# Patient Record
Sex: Female | Born: 1990 | Race: Black or African American | Hispanic: No | Marital: Single | Smoking: Current some day smoker
Health system: Southern US, Community
[De-identification: ages and names within clinical notes are randomized; demographics above are authoritative.]

## PROBLEM LIST (undated history)

## (undated) ENCOUNTER — Inpatient Hospital Stay (HOSPITAL_COMMUNITY): Payer: Self-pay

## (undated) DIAGNOSIS — Z973 Presence of spectacles and contact lenses: Secondary | ICD-10-CM

## (undated) DIAGNOSIS — J302 Other seasonal allergic rhinitis: Secondary | ICD-10-CM

## (undated) DIAGNOSIS — J45909 Unspecified asthma, uncomplicated: Secondary | ICD-10-CM

## (undated) HISTORY — DX: Other seasonal allergic rhinitis: J30.2

## (undated) HISTORY — DX: Unspecified asthma, uncomplicated: J45.909

## (undated) HISTORY — DX: Presence of spectacles and contact lenses: Z97.3

## (undated) HISTORY — PX: NO PAST SURGERIES: SHX2092

---
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
## 2012-05-12 ENCOUNTER — Ambulatory Visit: Payer: Self-pay | Admitting: Medical

## 2012-05-23 ENCOUNTER — Ambulatory Visit (INDEPENDENT_AMBULATORY_CARE_PROVIDER_SITE_OTHER): Payer: BC Managed Care – PPO | Admitting: Medical

## 2012-05-23 ENCOUNTER — Encounter: Payer: Self-pay | Admitting: Medical

## 2012-05-23 VITALS — BP 108/70 | HR 68 | Temp 98.0°F | Resp 16 | Ht 62.5 in | Wt 146.0 lb

## 2012-05-23 DIAGNOSIS — Z2089 Contact with and (suspected) exposure to other communicable diseases: Secondary | ICD-10-CM

## 2012-05-23 DIAGNOSIS — A599 Trichomoniasis, unspecified: Secondary | ICD-10-CM

## 2012-05-23 DIAGNOSIS — J45909 Unspecified asthma, uncomplicated: Secondary | ICD-10-CM

## 2012-05-23 DIAGNOSIS — Z202 Contact with and (suspected) exposure to infections with a predominantly sexual mode of transmission: Secondary | ICD-10-CM

## 2012-05-23 DIAGNOSIS — J309 Allergic rhinitis, unspecified: Secondary | ICD-10-CM

## 2012-05-23 DIAGNOSIS — Z0000 Encounter for general adult medical examination without abnormal findings: Secondary | ICD-10-CM

## 2012-05-23 DIAGNOSIS — J302 Other seasonal allergic rhinitis: Secondary | ICD-10-CM

## 2012-05-23 LAB — POCT WET PREP (WET MOUNT)
Bacteria Wet Prep HPF POC: NEGATIVE
Clue Cells Wet Prep Whiff POC: NEGATIVE
WBC, Wet Prep HPF POC: NEGATIVE

## 2012-05-23 LAB — LIPID PANEL
Cholesterol: 139 mg/dL (ref 0–200)
LDL Cholesterol: 81 mg/dL (ref 0–99)
Total CHOL/HDL Ratio: 3.2 Ratio
VLDL: 15 mg/dL (ref 0–40)

## 2012-05-23 LAB — CBC
HCT: 38.9 % (ref 36.0–46.0)
Hemoglobin: 12.7 g/dL (ref 12.0–15.0)
MCV: 81.6 fL (ref 78.0–100.0)
RBC: 4.77 MIL/uL (ref 3.87–5.11)
WBC: 6.5 10*3/uL (ref 4.0–10.5)

## 2012-05-23 LAB — BASIC METABOLIC PANEL
BUN: 17 mg/dL (ref 6–23)
Chloride: 104 mEq/L (ref 96–112)
Creat: 0.84 mg/dL (ref 0.50–1.10)
Potassium: 4.4 mEq/L (ref 3.5–5.3)

## 2012-05-23 MED ORDER — ALBUTEROL SULFATE HFA 108 (90 BASE) MCG/ACT IN AERS: 2.0000 | INHALATION_SPRAY | Freq: Four times a day (QID) | RESPIRATORY_TRACT | Status: DC | PRN

## 2012-05-23 NOTE — Patient Instructions (Signed)
I prescribed Albuterol inhaler today.   You can use 2 puffs before exercise.    If this is not helping, then return to discuss.  I do want you exercising regularly.  Use condoms! Always.    Consider the birth control options we discussed today.  We will call you with lab results.   I would like to see you back in a month or 2 to see if the albuterol is helping with exercise.

## 2012-05-23 NOTE — Progress Notes (Signed)
Subjective:   HPI  Kim Kirby is a 22 y.o. female who presents for a complete physical.  New patient today.   Her mother is a patient of mine.   She is mainly concerned about getting re screened for STD.  She was seen by provider in [place] recently and was +trichomonas asymptomatic.   She was negative for HIV, syphilis, gonorrhea, chlamydia, but ended up having treatment for chlamydia and trich.   She was on OCPs few years ago, but stopped due to fears of side effects and issues with not remembering to take the medication.  She has 1 sexual partner, using condoms now.  No prior pregnancy.  Periods are regular.  Hx/o exercise induced asthma.   No recent flare up, but not exercising either .  Currently dose not have an inhaler.   Past Medical History  Diagnosis Date  . Asthma   . Seasonal allergic rhinitis     spring  . Wears glasses     History reviewed. No pertinent past surgical history.  Family History  Problem Relation Age of Onset  . Hypertension Mother   . Other Father     unknown  . Cancer Maternal Aunt     lung  . Heart disease Neg Hx   . Diabetes Neg Hx   . Stroke Neg Hx     History   Social History  . Marital Status: Single    Spouse Name: N/A    Number of Children: N/A  . Years of Education: N/A   Occupational History  . Not on file.   Social History Main Topics  . Smoking status: Current Some Day Smoker  . Smokeless tobacco: Not on file  . Alcohol Use: No     Comment: sometimes  . Drug Use: Yes    Special: Marijuana  . Sexually Active: Not on file   Other Topics Concern  . 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 albuterol (PROVENTIL HFA;VENTOLIN HFA) 108 (90 BASE) MCG/ACT inhaler Inhale 2 puffs into the lungs every 6 (six) hours as needed for wheezing.  1 Inhaler  1     No Known Allergies  Reviewed their medical, surgical, family, social, medication, and allergy history and updated chart as appropriate.   Review of Systems Constitutional: -fever, -chills, -sweats, -unexpected weight change, -decreased appetite, -fatigue Allergy: -sneezing, -itching, -congestion Dermatology: -changing moles, --rash, -lumps ENT: -runny nose, -ear pain, -sore throat, -hoarseness, -sinus pain, -teeth pain, - ringing in ears, -hearing loss, -nosebleeds Cardiology: -chest pain, -palpitations, -swelling, -difficulty breathing when lying flat, -waking up short of breath Respiratory: -cough, -shortness of breath, -difficulty breathing with exercise or exertion, -wheezing, -coughing up blood Gastroenterology: -abdominal pain, -nausea, -vomiting, -diarrhea, -constipation, -blood in stool, -changes in bowel movement, -difficulty swallowing or eating Hematology: -bleeding, -bruising  Musculoskeletal: -joint aches, -muscle aches, -joint swelling, -back pain, -neck pain, -cramping, -changes in gait Ophthalmology: denies vision changes, eye redness, itching, discharge Urology: -burning with urination, -difficulty urinating, -blood in urine, -urinary frequency, -urgency, -incontinence Neurology: -headache, -weakness, -tingling, -numbness, -memory loss, -falls, -dizziness Psychology: -depressed mood, -agitation, -sleep problems     Objective:   Physical Exam  Filed Vitals:   05/23/12 0858  BP: 108/70  Pulse: 68  Temp: 98 F (36.7 C)  Resp: 16  General appearance: alert, no distress, WD/WN, AA female Skin: multiple tattoos - left chest, right wrist, other tattoos.  Few scattered macules.   HEENT: normocephalic, conjunctiva/corneas normal, sclerae anicteric, PERRLA, EOMi, nares patent, no discharge or erythema, pharynx normal Oral cavity: MMM, tongue normal, teeth normal Neck: supple, no lymphadenopathy, no thyromegaly, no masses, normal ROM Chest: non tender, normal shape  and expansion Heart: RRR, normal S1, S2, no murmurs Lungs: CTA bilaterally, no wheezes, rhonchi, or rales Abdomen: +bs, soft, non tender, non distended, no masses, no hepatomegaly, no splenomegaly, no bruits Back: non tender, normal ROM, no scoliosis Musculoskeletal: upper extremities non tender, no obvious deformity, normal ROM throughout, lower extremities non tender, no obvious deformity, normal ROM throughout Extremities: no edema, no cyanosis, no clubbing Pulses: 2+ symmetric, upper and lower extremities, normal cap refill Neurological: alert, oriented x 3, CN2-12 intact, strength normal upper extremities and lower extremities, sensation normal throughout, DTRs 2+ throughout, no cerebellar signs, gait normal Psychiatric: normal affect, behavior normal, pleasant  Breast: deferred Gyn: Normal external genitalia without lesions, vagina with normal mucosa, cervix without lesions, no cervical motion tenderness, no abnormal vaginal discharge.  Uterus and adnexa not enlarged, nontender, no masses. Swabs taken.  Exam chaperoned by nurse. Rectal: deferred    Assessment and Plan :    Encounter Diagnoses  Name Primary?  . Routine general medical examination at a health care facility Yes  . Trichomonas   . Venereal disease contact   . Asthma   . Seasonal allergic rhinitis     Physical exam - discussed healthy lifestyle, diet, exercise, preventative care, vaccinations, and addressed their concerns.  Handout given.  Discussed risks/benefits of OCPs.   She will consider and let me know.  Trichomonas - resolved.   Wet prep without obvious trichomonads, yeasts, or BV today  Venereal disease contact - advised condom use, safe sex, prevention.   Advised STD screening q27mo.    Asthma - discussed her prior symptoms.  New script for albuterol.  discussed use prior to exercise.  Recheck 1-2 mo  Seasonal allergies - advised Zyrtec OTC in early spring  Follow-up pending labs

## 2012-05-24 LAB — GC/CHLAMYDIA PROBE AMP
CT Probe RNA: NEGATIVE
GC Probe RNA: NEGATIVE

## 2012-10-13 LAB — OB RESULTS CONSOLE HGB/HCT, BLOOD: HCT: 38 %

## 2012-10-13 LAB — OB RESULTS CONSOLE ABO/RH: RH Type: POSITIVE

## 2012-10-13 LAB — CULTURE, OB URINE: Urine Culture, OB: NO GROWTH

## 2012-10-13 LAB — OB RESULTS CONSOLE RPR: RPR: NONREACTIVE

## 2012-10-13 LAB — OB RESULTS CONSOLE RUBELLA ANTIBODY, IGM: Rubella: IMMUNE

## 2012-10-13 LAB — OB RESULTS CONSOLE GC/CHLAMYDIA: Chlamydia: NEGATIVE

## 2012-10-13 LAB — OB RESULTS CONSOLE VARICELLA ZOSTER ANTIBODY, IGG: Varicella: IMMUNE

## 2012-10-13 LAB — OB RESULTS CONSOLE HIV ANTIBODY (ROUTINE TESTING): HIV: NONREACTIVE

## 2012-12-02 ENCOUNTER — Encounter: Payer: Self-pay | Admitting: *Deleted

## 2012-12-12 ENCOUNTER — Encounter: Payer: Self-pay | Admitting: Advanced Practice Midwife

## 2012-12-12 ENCOUNTER — Ambulatory Visit (INDEPENDENT_AMBULATORY_CARE_PROVIDER_SITE_OTHER): Payer: BC Managed Care – PPO | Admitting: Advanced Practice Midwife

## 2012-12-12 ENCOUNTER — Encounter: Payer: Self-pay | Admitting: *Deleted

## 2012-12-12 VITALS — BP 114/68 | Temp 97.6°F | Wt 145.0 lb

## 2012-12-12 DIAGNOSIS — O0930 Supervision of pregnancy with insufficient antenatal care, unspecified trimester: Secondary | ICD-10-CM | POA: Insufficient documentation

## 2012-12-12 DIAGNOSIS — Z3400 Encounter for supervision of normal first pregnancy, unspecified trimester: Secondary | ICD-10-CM

## 2012-12-12 LAB — POCT URINALYSIS DIP (DEVICE)
Hgb urine dipstick: NEGATIVE
Nitrite: NEGATIVE
Protein, ur: NEGATIVE mg/dL
Specific Gravity, Urine: 1.025 (ref 1.005–1.030)
Urobilinogen, UA: 0.2 mg/dL (ref 0.0–1.0)

## 2012-12-12 NOTE — Progress Notes (Signed)
   Subjective:    Kim Kirby is a G1P0 [redacted]w[redacted]d being seen today for her first obstetrical visit.  Her obstetrical history is significant for None. Patient does intend to breast feed. Pregnancy history fully reviewed.  Patient reports fatigue.  Filed Vitals:   12/12/12 1007  BP: 114/68  Temp: 97.6 F (36.4 C)  Weight: 145 lb (65.772 kg)    HISTORY: OB History  Gravida Para Term Preterm AB SAB TAB Ectopic Multiple Living  1             # Outcome Date GA Lbr Len/2nd Weight Sex Delivery Anes PTL Lv  1 CUR              Past Medical History  Diagnosis Date  . Asthma   . Seasonal allergic rhinitis     spring  . Wears glasses    History reviewed. No pertinent past surgical history. Family History  Problem Relation Age of Onset  . Hypertension Mother   . Other Father     unknown  . Cancer Maternal Aunt     lung  . Heart disease Neg Hx   . Diabetes Neg Hx   . Stroke Neg Hx      Exam    Uterus:   Fundus @umbilicus   Pelvic Exam: Pelvic exam deferred--done at [facility]   Neurologic: oriented, normal, normal mood, gait normal; reflexes normal and symmetric   Extremities: normal strength, tone, and muscle mass   HEENT neck supple with midline trachea and thyroid without masses   Mouth/Teeth mucous membranes moist, pharynx normal without lesions   Neck supple and no masses   Cardiovascular: regular rate and rhythm   Respiratory:  appears well, vitals normal, no respiratory distress, acyanotic, normal RR, ear and throat exam is normal, neck free of mass or lymphadenopathy, chest clear, no wheezing, crepitations, rhonchi, normal symmetric air entry   Abdomen:    Urinary:       Assessment:    Pregnancy: G1P0 Patient Active Problem List   Diagnosis Date Noted  . Supervision of normal first pregnancy 12/12/2012        Plan:     Initial labs drawn. Prenatal vitamins. Problem list reviewed and updated. Genetic Screening discussed : declined.  Ultrasound discussed; fetal survey: ordered.  Follow up in 3 weeks. 50% of 30 min visit spent on counseling and coordination of care.    [name], Emeterio Balke 12/12/2012

## 2012-12-12 NOTE — Progress Notes (Signed)
Pulse 119 Edema trace in feet.

## 2012-12-16 ENCOUNTER — Ambulatory Visit (HOSPITAL_COMMUNITY)
Admission: RE | Admit: 2012-12-16 | Discharge: 2012-12-16 | Disposition: A | Discharge status: Home / Self Care | Payer: BC Managed Care – PPO | Source: Ambulatory Visit | Attending: Advanced Practice Midwife | Admitting: Advanced Practice Midwife

## 2012-12-16 DIAGNOSIS — Z3400 Encounter for supervision of normal first pregnancy, unspecified trimester: Secondary | ICD-10-CM

## 2012-12-16 DIAGNOSIS — Z363 Encounter for antenatal screening for malformations: Secondary | ICD-10-CM | POA: Insufficient documentation

## 2012-12-16 DIAGNOSIS — Z1389 Encounter for screening for other disorder: Secondary | ICD-10-CM | POA: Insufficient documentation

## 2012-12-16 DIAGNOSIS — O358XX0 Maternal care for other (suspected) fetal abnormality and damage, not applicable or unspecified: Secondary | ICD-10-CM | POA: Insufficient documentation

## 2012-12-18 ENCOUNTER — Emergency Department (HOSPITAL_COMMUNITY)
Admission: EM | Admit: 2012-12-18 | Discharge: 2012-12-19 | Disposition: A | Discharge status: Home / Self Care | Payer: BC Managed Care – PPO | Attending: Emergency Medicine | Admitting: Emergency Medicine

## 2012-12-18 DIAGNOSIS — K011 Impacted teeth: Secondary | ICD-10-CM

## 2012-12-18 DIAGNOSIS — K006 Disturbances in tooth eruption: Secondary | ICD-10-CM | POA: Insufficient documentation

## 2012-12-18 DIAGNOSIS — O9989 Other specified diseases and conditions complicating pregnancy, childbirth and the puerperium: Secondary | ICD-10-CM | POA: Insufficient documentation

## 2012-12-18 DIAGNOSIS — Z792 Long term (current) use of antibiotics: Secondary | ICD-10-CM | POA: Insufficient documentation

## 2012-12-18 DIAGNOSIS — J45909 Unspecified asthma, uncomplicated: Secondary | ICD-10-CM | POA: Insufficient documentation

## 2012-12-18 DIAGNOSIS — Z79899 Other long term (current) drug therapy: Secondary | ICD-10-CM | POA: Insufficient documentation

## 2012-12-18 DIAGNOSIS — Z87891 Personal history of nicotine dependence: Secondary | ICD-10-CM | POA: Insufficient documentation

## 2012-12-18 DIAGNOSIS — Z8709 Personal history of other diseases of the respiratory system: Secondary | ICD-10-CM | POA: Insufficient documentation

## 2012-12-18 DIAGNOSIS — R6884 Jaw pain: Secondary | ICD-10-CM | POA: Insufficient documentation

## 2012-12-19 ENCOUNTER — Encounter (HOSPITAL_COMMUNITY): Payer: Self-pay

## 2012-12-19 MED ORDER — PENICILLIN V POTASSIUM 500 MG PO TABS: 500.0000 mg | ORAL_TABLET | Freq: Four times a day (QID) | ORAL | Status: AC

## 2012-12-19 MED ORDER — OXYCODONE-ACETAMINOPHEN 5-325 MG PO TABS
1.0000 | ORAL_TABLET | Freq: Once | ORAL | Status: AC
  Administered 2012-12-19: 1 via ORAL
  Filled 2012-12-19: qty 1

## 2012-12-19 NOTE — ED Provider Notes (Signed)
CSN: [id number]     Arrival date & time 12/18/12  2355 History   First MD Initiated Contact with Patient 12/19/12 0018     Chief Complaint  Patient presents with  . Jaw Pain   (Consider location/radiation/quality/duration/timing/severity/associated sxs/prior Treatment) HPI Patient reports 2 days of left-sided jaw pain.  Her pain is been constant.  She denies ear pain.  No fevers or chills.  No difficulty breathing or swallowing.  She reports the tenderness is located in her left posterior jaw.  No facial swelling.  No lymphadenopathy noted by the patient.  Her pain is mild to moderate in severity at this time.  Currently she is 5 months pregnant. Past Medical History  Diagnosis Date  . Asthma   . Seasonal allergic rhinitis     spring  . Wears glasses    History reviewed. No pertinent past surgical history. Family History  Problem Relation Age of Onset  . Hypertension Mother   . Other Father     unknown  . Cancer Maternal Aunt     lung  . Heart disease Neg Hx   . Diabetes Neg Hx   . Stroke Neg Hx    History  Substance Use Topics  . Smoking status: Former Games developer  . Smokeless tobacco: Never Used  . Alcohol Use: No     Comment: sometimes   OB History   Grav Para Term Preterm Abortions TAB SAB Ect Mult Living   1              Review of Systems  All other systems reviewed and are negative.    Allergies  Review of patient's allergies indicates no known allergies.  Home Medications   Current Outpatient Rx  Name  Route  Sig  Dispense  Refill  . Prenatal Vit-Fe Fumarate-FA (MULTIVITAMIN-PRENATAL) 27-0.8 MG TABS tablet   Oral   Take 1 tablet by mouth daily at 12 noon.         Marland Kitchen albuterol (PROVENTIL HFA;VENTOLIN HFA) 108 (90 BASE) MCG/ACT inhaler   Inhalation   Inhale 2 puffs into the lungs every 6 (six) hours as needed for wheezing.   1 Inhaler   1   . penicillin v potassium (VEETID) 500 MG tablet   Oral   Take 1 tablet (500 mg total) by mouth 4 (four) times  daily.   40 tablet   0    BP 109/73  Pulse 88  Temp(Src) 97.5 F (36.4 C) (Oral)  Resp 14  SpO2 99%  LMP 06/20/2012 Physical Exam  Nursing note and vitals reviewed. Constitutional: She is oriented to person, place, and time. She appears well-developed and well-nourished. No distress.  HENT:  Head: Normocephalic and atraumatic.  Is with impacted left lower third molar.  Tenderness surrounding this with some inflamed tissue.  No gingival swelling or fluctuance.  Tolerating secretions.  Oral airway patent.  Neck: Normal range of motion.  Cardiovascular: Normal rate.   Pulmonary/Chest: Effort normal.  Abdominal: Soft. There is no tenderness.  Musculoskeletal: Normal range of motion.  Neurological: She is alert and oriented to person, place, and time.  Skin: Skin is warm and dry.  Psychiatric: She has a normal mood and affect. Judgment normal.    ED Course  Procedures (including critical care time) Labs Review Labs Reviewed - No data to display Imaging Review No results found.  MDM   1. Impacted molar    Impacted third molar.  Antibiotics.  Oral surgery followup.    Vania Rea  Patria Mane, MD 12/19/12 2440

## 2012-12-19 NOTE — ED Notes (Signed)
Pt complains of left jaw pain for two days

## 2012-12-19 NOTE — ED Notes (Signed)
Pt reports being 5 months pregnant.

## 2012-12-20 ENCOUNTER — Emergency Department (HOSPITAL_COMMUNITY)
Admission: EM | Admit: 2012-12-20 | Discharge: 2012-12-20 | Disposition: A | Discharge status: Home / Self Care | Payer: BC Managed Care – PPO | Attending: Emergency Medicine | Admitting: Emergency Medicine

## 2012-12-20 ENCOUNTER — Encounter (HOSPITAL_COMMUNITY): Payer: Self-pay

## 2012-12-20 DIAGNOSIS — K011 Impacted teeth: Secondary | ICD-10-CM

## 2012-12-20 DIAGNOSIS — R6884 Jaw pain: Secondary | ICD-10-CM | POA: Insufficient documentation

## 2012-12-20 DIAGNOSIS — O9989 Other specified diseases and conditions complicating pregnancy, childbirth and the puerperium: Secondary | ICD-10-CM | POA: Insufficient documentation

## 2012-12-20 DIAGNOSIS — Z87891 Personal history of nicotine dependence: Secondary | ICD-10-CM | POA: Insufficient documentation

## 2012-12-20 DIAGNOSIS — Z79899 Other long term (current) drug therapy: Secondary | ICD-10-CM | POA: Insufficient documentation

## 2012-12-20 DIAGNOSIS — K006 Disturbances in tooth eruption: Secondary | ICD-10-CM | POA: Insufficient documentation

## 2012-12-20 DIAGNOSIS — J45909 Unspecified asthma, uncomplicated: Secondary | ICD-10-CM | POA: Insufficient documentation

## 2012-12-20 DIAGNOSIS — Z792 Long term (current) use of antibiotics: Secondary | ICD-10-CM | POA: Insufficient documentation

## 2012-12-20 MED ORDER — ACETAMINOPHEN 325 MG PO TABS
650.0000 mg | ORAL_TABLET | Freq: Once | ORAL | Status: AC
  Administered 2012-12-20: 650 mg via ORAL
  Filled 2012-12-20: qty 2

## 2012-12-20 NOTE — ED Notes (Signed)
Patient states she had left jaw. States that it is caused by an infection in her wisdom tooth. Patient has been to see a doctor about a week ago who stated that she had an infection. Patient saw another doctor her 2 days ago which stated the same thing. Patient stated that she picked up the antibiotics yesterday and started them yesterday. Stated that she came in because the pain is getting worse.

## 2012-12-20 NOTE — Discharge Instructions (Signed)
 Impacted Molar Molars are the teeth in the back of your mouth. You have 12 molars. There are 6 molars in each jaw, 3 on each side. When they grow in (erupt) they sometimes cause problems. Molars trapped inside the gum are impacted molars. Impacted molars may grow sideways, tilted, or may only partially emerge. Molars erupt at different times in life. The first set of molars usually erupts around 27 to 20 years of age. The second set of molars typically erupts around 84 to 20 years of age. The third set of molars are called wisdom teeth. These molars usually do not have enough space to erupt properly. Many teens and young adults develop impacted wisdom teeth and have them surgically removed (extracted). However, any molar or set of molars may become impacted. CAUSES  Teeth that are crowded are often the reason for an impacted molar, but sometimes a cyst or tumor may cause impaction of molars. SYMPTOMS  Sometimes there are no symptoms and an impacted molar is noticed during an exam or X-ray. If there are symptoms they may include:  Pain.  Swelling, redness, or inflammation near the impacted tooth or teeth.  Stiff jaw.  General feeling of illness.  Bad breath.  Gap between the teeth.  Difficulty opening your mouth.  Headache or jaw ache.  Swollen lymph nodes. Impacted teeth may increase the risk of complications such as:  Infection, with possible drainage around the infected area.  Damage to nearby teeth.  Growth of cysts.  Chronic discomfort. DIAGNOSIS  Impacted molars are diagnosed by oral exam and X-rays. TREATMENT  The goal of treatment is to obtain the best possible arrangement of your teeth. Your dentist or orthodontist will recommend the best course of action for you. After an exam, your caregiver may recommend one or a combination of the following treatments.  Supportive home care to manage pain and other symptoms until treatment can be started.  Surgical extraction of  one or a combination of molars to leave room for emerging or later molars. Teeth must be extracted at appropriate times for the best results.  Surgical uncovering of tissue covering the impacted molar.  Orthodontic repositioning with the use of appliances such as elastic or metal separators, braces, wires, springs, and other removable or fixed devices. This is done to guide the molar and surrounding teeth to grow in properly. In some cases, you may need some surgery to assist this procedure. Follow-up orthodontic treatment is often necessary with impacted first and second molars.  Antibiotics to treat infection. HOME CARE INSTRUCTIONS Rinse as directed with an antibacterial solution or salt and warm water. Follow up with your caregiver as directed, even if you do not have symptoms. If you are waiting for treatment and have pain:  Take pain medicines as directed.  Take your antibiotics as directed. Finish them even if you start to feel better.  Put ice on the affected area.  Put ice in a plastic bag.  Place a towel between your skin and the bag.  Leave the ice on for 15-20 minutes, 3-4 times a day. SEEK DENTAL CARE IF:  You have a fever.  Pain emerges, worsens, or is not controlled by the medicines you were given.  Swelling occurs.  You have difficulty opening your mouth or swallowing. MAKE SURE YOU:   Understand these instructions.  Will watch your condition.  Will get help right away if you are not doing well or get worse. Document Released: 12/06/2010 Document Revised: 07/02/2011 Document Reviewed:  12/06/2010 ExitCare Patient Information 2014 [place], [place].      Wisdom Teeth Wisdom teeth are the last set of molars to grow in at the back of the mouth. A wisdom tooth can grow in each of the four back areas of the mouth, the:  Top right.  Top left.  Bottom right .  Bottom left. The 4 molars usually appear during later adolescence, between the ages of 53 and 40.  A small percentage of people are born with 1 or more missing wisdom teeth or have none at all. Wisdom teeth may become painful if:  There is not enough room in the mouth for them to emerge.  They are misaligned.  Infection develops. With proper alignment and adequate space, healthy wisdom teeth can be an asset. CAUSES Often there is not enough room in the back of the jaw for the wisdom teeth. They can become trapped inside the gum (impacted), They may also grow sideways or only partially erupt. SYMPTOMS The presence of wisdom teeth or impacted wisdom teeth may not cause any symptoms. However, problems with 1 or more wisdom teeth may result in:  Pain.  Swelling around the tooth.  Stiff jaw.  General feeling of illness.  Inability to fully open your mouth (trimus).  Bad breath or unpleasant taste in your mouth. Impacted teeth may increase the risk of:  Infection.  Damage to adjacent teeth.  Growth of cysts (formed when the sac surrounding the impaced tooth becomes filled with fluid and enlarges). DIAGNOSIS  Oral exam.  X-rays. TREATMENT Your dentist or oral surgeon will recommend the best course of action for you. This may include surgical removal (extraction) of the wisdom teeth. Extraction is generally recommended to avoid complications. Removal and recovery time is easier if done in early adulthood since the roots of the teeth are smaller at this time and surrounding bone is softer. Sometimes your dentist or oral surgeon may recommend extraction before symptoms develop. This is done to avoid a more painful or complicated extraction at a later date. HOME CARE INSTRUCTIONS  Follow up with your caregiver as directed.  If your wisdom teeth are causing pain or other symptoms:  Only take over-the-counter or prescription medicines for pain, discomfort, or fever as directed by your caregiver.  Ice the affected area for 20 minutes at a time, 3 to 4 times per day.Place a  towel on the skin over the painful area and the ice or cold pack over the towel. Do not place ice directly on the skin.  Eat soft foods or a liquid diet.   Mild or moderate fevers generally have no long-term effects and often do not require treatment. PROGNOSIS Most patients recover fully from tooth extraction with proper care and pain management.  SEEK DENTAL DENTAL CARE IF :  Pain emerges, worsens, or is not controlled by the medicine you have been given.  Bleeding occurs. SEEK IMMEDIATE DENTAL CARE IF:  You have a fever or persistent symptoms for more than 72 hours.  You have a fever and your symptoms suddenly get worse. FOR MORE INFORMATION  American Dental Association https://www.choi-stevens.org/  American Association of Oral and Maxillofacial Surgeons http://www.aaoms.org/http://www.aaoms.org/ MAKE SURE YOU  Understand these instructions.  Will watch your condition.  Will get help right away if you are not doing well or get worse. Document Released: 05/12/2010 Document Revised: 07/02/2011 Document Reviewed: 05/12/2010 [facility] Patient Information 2014 [place], [place].      RESOURCE GUIDE  Chronic Pain Problems: Contact [facility]  [phone] Patients need to be referred by their primary care doctor.  Insufficient Money for Medicine: Contact United Way:  call [phone]  No Primary Care Doctor: - Call Health Connect  [phone] - can help you locate a primary care doctor that  accepts your insurance, provides certain services, etc. - Physician Referral Service- [phone]  Agencies that provide inexpensive medical care: - [facility]  [phone] - [facility]  [phone] - [facility]  [phone] - Women's Clinic  [phone] - Planned Parenthood  [phone] [facility]  [phone]  Medicaid-accepting [facility] Providers: - [facility]- [address]  [phone], Mon-Fri 9am-7pm, Sat 9am-1pm - [facility]- [address] [place], Suite [place]  [phone] - [facility]- [address] [place]  [phone] [facility]- [address]  [phone] - Kennieth Leech- [address] [place], [address], [phone]  Only accepts [place] patients after they have their name  applied to their card  Self Pay (no insurance) in [place]: - Sickle Cell Patients - [facility]  [address] [place], [phone] - [facility]- [address] [place]  [phone]       [place] [facility] [place]- [address]       -     [facility]- see information above (Speak to [employer] if you do not have insurance)       -  [facility]- [address],  [phone]       -  [facility]- [address] [phone]       -  Dr Catalina-  [address], [place], [phone]       -  [facility] - [address], [phone]       -  [facility]- [address] [phone], also [address] [phone]       -     [facility]- [address] [place], [phone], 1st & 3rd Saturday         every month, 10am-1pm  -     [facility] [address]   [address] [place], [place].   Phone:  [phone], Fax:  [phone]. Hours of Operation:  9 am - 6 pm, M-F.  -     [facility]   [address], [place]   Phone: [phone], Fax: [phone]. Hours of Operation:  8:30 am - 5:30 pm, M-F.  [facility] [address] [place], [place] [address] [phone]  [facility] [address] [place], [place] [address] [phone]  1) Find a Doctor and Pay Out of Pocket Although you won't have to find out who is covered by your insurance plan, it is a good idea to ask  around and get recommendations. You will then need to call the office and see if the doctor you have chosen will accept you as a new patient and what types of options they offer for patients who are self-pay. Some doctors offer discounts or will set up payment plans for their patients who do not have insurance, but you will need to ask so you aren't surprised  when you get to your appointment.  2) Contact Your Local Health Department Not all health departments have doctors that can see patients for sick visits, but many do, so it is worth a call to see if yours does. If you don't know where your local health department is, you can check in your phone book. The CDC also has a tool to help you locate your state's health department, and many state websites also have listings of all of their local health departments.  3) Find a Walk-in Clinic If your illness is not likely to be very severe or complicated, you may want to try a walk in clinic. These are popping up all over the country in pharmacies, drugstores, and shopping centers. They're usually staffed by nurse practitioners or physician assistants that have been trained to treat common illnesses and complaints. They're usually fairly quick and inexpensive. However, if you have serious medical issues or chronic medical problems, these are probably not your best option  STD Testing - [facility] Department of [facility], STD Clinic, [address] [place], phone [phone] or [phone].  Monday - Friday, call for an appointment. [facility] Department of [employer], STD Clinic, [address], [place], phone [phone] or [phone].  Monday - Friday, call for an appointment.  Abuse/Neglect: [facility] Child Abuse Hotline 6616512591 [facility] Child Abuse Hotline (562) 677-3805 (After Hours)  Emergency Shelter:  [facility] [phone]  Maternity Homes: - Room at  the [place] [facility] [phone] - [facility] [phone]  MRSA Hotline #:   (574) 248-1946  Dental Assistance If unable to pay or uninsured, contact:  [facility]. to become qualified for the adult dental clinic.  Patients with Medicaid: [facility] [phone] [address], [phone] [address] [phone]  If unable to pay, or uninsured, contact [facility] [phone] in [place], [phone] in [facility]) to become qualified for the adult dental clinic  [facility] [address] [place], [place] [address] [phone] [email or web address]  Other Proofreader Services: - [facility]- [address] [place], [place], [place], [address], [phone], Ext. [phone], 2nd and 4th Thursday of the month at 6:30am.  10 clients each day by appointment, can sometimes see walk-in patients if someone does not show for an appointment. [facility]- [address] [place], [place], [address], [phone] - [facility] [address], [place], [place], [address], [phone] - [facility]- [phone] [facility] Health Department- [phone] [facility] Health [phone]       Behavioral Health Resources in the [facility]  Intensive Outpatient Programs: [facility]      [address] [place], [place] [phone] Both a day and evening program       [facility] Outpatient     [address]        [place], [place] [address] [phone]         ADS: [facility] [address] [place] [phone]  [facility] Mental Health ACCESS LINE: [phone] or [phone] [address] [place], [place] [address] [email or web address]   Substance Abuse Resources: - [facility] Drug [facility]  [phone] - [facility] [phone] - The [place] [phone] [place] [phone] - Residential & Outpatient Substance Abuse Program  [phone]  Psychological Services: - [facility]  [phone] Services  [phone] - [facility], [phone] [place]. [address] [place], ACCESS LINE: [phone] or [phone], [email or web address]  Mobile Crisis Teams:                                        Therapeutic Alternatives         Mobile Crisis Care Unit [phone]             [facility] [address] [place] [phone]                                         Interventionist [address] [place] [address] [place] [phone]  Self-Help/Support Groups: Mental Health Assoc. of [employer] of support groups [phone] (call for more info)  Narcotics Anonymous (NA) Caring Services [address] [place] - 2 meetings at this location  Residential Treatment Programs:  [facility]      [address]        [place]       [phone]         [facility] [address], [place] [address]2881 [place], [place]  [address] [phone]  [facility]  [address] [place], [place] [address] [phone] Admissions: 8am-3pm M-F  [facility]     [address]        [place], [place] [address]       [phone]         [facility] [address] Christianna COYER [place], [place] [phone]  [facility] [address] [place], [place] [phone]  [facility] - Intensive Outpatient      [address]     [place], [place]       [phone]         [facility] ([facility]     [address] [place], [place] [phone] or [phone]  Residential Treatment Services (RTS), Medicaid [address] [place],  [place] [phone]  [facility]ellowship [address]                                               [address] [place] [phone]  [facility] Resources: [phone]               General Therapy                                                Mliss Rival, PhD        [address] [place], [place] [address]         [phone]   Insurance  [facility] Behavioral   [address] [place], [place]  [address] [phone]  [facility] [address] [place], [place] [address] [phone] Insurance/Medicaid/sponsorship through [facility]                                              [address]                                        [place], [place] [address]    Therapy/tele-psych/case         [phone]          [facility] [address], [place]  [address]  Adolescent/group home/case management [phone]                                           Recardo Rival PhD       General therapy       Insurance   [phone]         Dr. Curry, [place], M-F [phone]  [facility]  United [employer] [facility] [place] Dept. [address]                 [address]         [address] [place] [address]  Tinnie Keenan Keenan Phone:  [phone]                                  Phone:  [phone]                   Phone:  [phone]  [facility], [phone] - [facility] - [facility]- [phone]       -     [facility] in [place], [address],             [phone], Insurance  [place] Child Abuse Hotline (267) [phone] or [phone] (After Hours)

## 2012-12-20 NOTE — ED Provider Notes (Signed)
CSN: [id number]     Arrival date & time 12/20/12  0713 History   First MD Initiated Contact with Patient 12/20/12 0732     Chief Complaint  Patient presents with  . Jaw Pain   (Consider location/radiation/quality/duration/timing/severity/associated sxs/prior Treatment) HPI Comments: Pt was seen yesterday and give Rx for penicillin.  She was referred to dentist but reports didn't call to make an appt.  Pt is about 5 months pregnant, doing well from that standpoint.  Has been taking abx, but no sig relief.  She reprots she was not aware she could take tylenol and the abx together.    Patient is a 22 y.o. female presenting with tooth pain. The history is provided by the patient.  Dental Pain Location:  Lower Lower teeth location:  17/LL 3rd molar Quality:  Shooting, aching and constant Severity:  Severe Onset quality:  Gradual Duration:  4 days Timing:  Constant Chronicity:  New Relieved by:  Nothing Worsened by:  Hot food/drink, jaw movement, touching and cold food/drink Ineffective treatments:  None tried Associated symptoms: no fever     Past Medical History  Diagnosis Date  . Asthma   . Seasonal allergic rhinitis     spring  . Wears glasses    History reviewed. No pertinent past surgical history. Family History  Problem Relation Age of Onset  . Hypertension Mother   . Other Father     unknown  . Cancer Maternal Aunt     lung  . Heart disease Neg Hx   . Diabetes Neg Hx   . Stroke Neg Hx    History  Substance Use Topics  . Smoking status: Former Games developer  . Smokeless tobacco: Never Used  . Alcohol Use: No     Comment: sometimes   OB History   Grav Para Term Preterm Abortions TAB SAB Ect Mult Living   1              Review of Systems  Constitutional: Negative for fever and chills.  HENT: Positive for dental problem.   Gastrointestinal: Negative for abdominal pain.  Genitourinary: Negative for vaginal bleeding.    Allergies  Review of patient's allergies  indicates no known allergies.  Home Medications   Current Outpatient Rx  Name  Route  Sig  Dispense  Refill  . albuterol (PROVENTIL HFA;VENTOLIN HFA) 108 (90 BASE) MCG/ACT inhaler   Inhalation   Inhale 2 puffs into the lungs every 6 (six) hours as needed for wheezing.   1 Inhaler   1   . penicillin v potassium (VEETID) 500 MG tablet   Oral   Take 1 tablet (500 mg total) by mouth 4 (four) times daily.   40 tablet   0   . Prenatal Vit-Fe Fumarate-FA (MULTIVITAMIN-PRENATAL) 27-0.8 MG TABS tablet   Oral   Take 1 tablet by mouth daily at 12 noon.          BP 119/81  Pulse 95  Temp(Src) 98.5 F (36.9 C) (Oral)  Resp 16  SpO2 100%  LMP 06/20/2012 Physical Exam  Nursing note and vitals reviewed. Constitutional: She is oriented to person, place, and time. She appears well-developed and well-nourished. No distress.  HENT:  Head: Normocephalic and atraumatic.  Right Ear: External ear normal.  Left Ear: External ear normal.  Mouth/Throat: Uvula is midline and oropharynx is clear and moist.    Eyes: EOM are normal.  Pulmonary/Chest: Effort normal. No respiratory distress.  Abdominal: Soft.  Lymphadenopathy:    She  has no cervical adenopathy.  Neurological: She is alert and oriented to person, place, and time.  Skin: Skin is warm. She is not diaphoretic.    ED Course  Procedures (including critical care time) Labs Review Labs Reviewed - No data to display Imaging Review No results found.  RA sat is 100% and I interpret to be normal  MDM   1. Impacted third molar tooth    I reviewed note from yesterday.  Pt with impacted left lower 3rd molar.  No fluctuance noted.  Tender to palpate.  Pt offered dental block, narcotics, declines both.  Reassured pt she could take tylenol and PCN together . Pt instructed to call dentist that she was referred to on Tuesday and I have also referred to Dr. Jeanice Lim as needed for Tuesday as well.  Pt and family both understand instructions.       Gavin Pound. Oletta Lamas, MD 12/20/12 [phone]

## 2012-12-31 ENCOUNTER — Ambulatory Visit (HOSPITAL_COMMUNITY): Admission: RE | Admit: 2012-12-31 | Payer: BC Managed Care – PPO | Source: Ambulatory Visit

## 2012-12-31 ENCOUNTER — Ambulatory Visit (INDEPENDENT_AMBULATORY_CARE_PROVIDER_SITE_OTHER): Payer: BC Managed Care – PPO | Admitting: Advanced Practice Midwife

## 2012-12-31 ENCOUNTER — Ambulatory Visit (HOSPITAL_COMMUNITY)
Admission: RE | Admit: 2012-12-31 | Discharge: 2012-12-31 | Disposition: A | Discharge status: Home / Self Care | Payer: BC Managed Care – PPO | Source: Ambulatory Visit | Attending: Family Medicine | Admitting: Family Medicine

## 2012-12-31 VITALS — BP 123/80 | Wt 149.0 lb

## 2012-12-31 DIAGNOSIS — Z3490 Encounter for supervision of normal pregnancy, unspecified, unspecified trimester: Secondary | ICD-10-CM

## 2012-12-31 DIAGNOSIS — Z1389 Encounter for screening for other disorder: Secondary | ICD-10-CM | POA: Insufficient documentation

## 2012-12-31 DIAGNOSIS — Z363 Encounter for antenatal screening for malformations: Secondary | ICD-10-CM | POA: Insufficient documentation

## 2012-12-31 DIAGNOSIS — O0930 Supervision of pregnancy with insufficient antenatal care, unspecified trimester: Secondary | ICD-10-CM

## 2012-12-31 LAB — POCT URINALYSIS DIP (DEVICE)
Glucose, UA: NEGATIVE mg/dL
Hgb urine dipstick: NEGATIVE
Nitrite: NEGATIVE
Urobilinogen, UA: 0.2 mg/dL (ref 0.0–1.0)
pH: 7 (ref 5.0–8.0)

## 2012-12-31 NOTE — Progress Notes (Signed)
P - 105 

## 2012-12-31 NOTE — Progress Notes (Signed)
Doing well.  Good fetal movement, denies vaginal bleeding, LOF, regular contractions.  Pt is having leg cramps at night.  Discussed increased PO fluids, stretching, warm shower/bath before bed.

## 2013-01-21 ENCOUNTER — Encounter: Payer: Self-pay | Admitting: Family Medicine

## 2013-01-21 ENCOUNTER — Ambulatory Visit (INDEPENDENT_AMBULATORY_CARE_PROVIDER_SITE_OTHER): Payer: BC Managed Care – PPO | Admitting: Family Medicine

## 2013-01-21 VITALS — BP 130/89 | Temp 97.0°F | Wt 152.4 lb

## 2013-01-21 DIAGNOSIS — O0933 Supervision of pregnancy with insufficient antenatal care, third trimester: Secondary | ICD-10-CM

## 2013-01-21 DIAGNOSIS — O0930 Supervision of pregnancy with insufficient antenatal care, unspecified trimester: Secondary | ICD-10-CM

## 2013-01-21 LAB — POCT URINALYSIS DIP (DEVICE)
Bilirubin Urine: NEGATIVE
Glucose, UA: NEGATIVE mg/dL
Ketones, ur: NEGATIVE mg/dL
Protein, ur: NEGATIVE mg/dL
Specific Gravity, Urine: 1.01 (ref 1.005–1.030)

## 2013-01-21 MED ORDER — PRENATAL 19 PO CHEW: 1.0000 | CHEWABLE_TABLET | Freq: Every day | ORAL | Status: DC

## 2013-01-21 NOTE — Progress Notes (Signed)
S: pt is 22yo G1 at [redacted]w[redacted]d here for return robv - getting charlie horses in her legs - not taking PNV  O: see flowsheet  A/P:  - pt declines flu shot - PN chews sent to pharmacy. May help cramping in legs. Recommend she take - f/u in 2 weeks for 28 week visit

## 2013-01-21 NOTE — Progress Notes (Signed)
P= 116 Patient not taking prenatals because they make her sick. C/o of heartburn after eating intermittently, states TUMS do not help.

## 2013-01-21 NOTE — Patient Instructions (Signed)
Pregnancy - Second Trimester The second trimester of pregnancy (3 to 6 months) is a period of rapid growth for you and your baby. At the end of the sixth month, your baby is about 9 inches long and weighs 1 1/2 pounds. You will begin to feel the baby move between 18 and 20 weeks of the pregnancy. This is called quickening. Weight gain is faster. A clear fluid (colostrum) may leak out of your breasts. You may feel small contractions of the womb (uterus). This is known as false labor or Braxton-Hicks contractions. This is like a practice for labor when the baby is ready to be born. Usually, the problems with morning sickness have usually passed by the end of your first trimester. Some women develop small dark blotches (called cholasma, mask of pregnancy) on their face that usually goes away after the baby is born. Exposure to the sun makes the blotches worse. Acne may also develop in some pregnant women and pregnant women who have acne, may find that it goes away. PRENATAL EXAMS  Blood work may continue to be done during prenatal exams. These tests are done to check on your health and the probable health of your baby. Blood work is used to follow your blood levels (hemoglobin). Anemia (low hemoglobin) is common during pregnancy. Iron and vitamins are given to help prevent this. You will also be checked for diabetes between 24 and 28 weeks of the pregnancy. Some of the previous blood tests may be repeated.  The size of the uterus is measured during each visit. This is to make sure that the baby is continuing to grow properly according to the dates of the pregnancy.  Your blood pressure is checked every prenatal visit. This is to make sure you are not getting toxemia.  Your urine is checked to make sure you do not have an infection, diabetes or protein in the urine.  Your weight is checked often to make sure gains are happening at the suggested rate. This is to ensure that both you and your baby are  growing normally.  Sometimes, an ultrasound is performed to confirm the proper growth and development of the baby. This is a test which bounces harmless sound waves off the baby so your caregiver can more accurately determine due dates. Sometimes, a test is done on the amniotic fluid surrounding the baby. This test is called an amniocentesis. The amniotic fluid is obtained by sticking a needle into the belly (abdomen). This is done to check the chromosomes in instances where there is a concern about possible genetic problems with the baby. It is also sometimes done near the end of pregnancy if an early delivery is required. In this case, it is done to help make sure the baby's lungs are mature enough for the baby to live outside of the womb. CHANGES OCCURING IN THE SECOND TRIMESTER OF PREGNANCY Your body goes through many changes during pregnancy. They vary from person to person. Talk to your caregiver about changes you notice that you are concerned about.  During the second trimester, you will likely have an increase in your appetite. It is normal to have cravings for certain foods. This varies from person to person and pregnancy to pregnancy.  Your lower abdomen will begin to bulge.  You may have to urinate more often because the uterus and baby are pressing on your bladder. It is also common to get more bladder infections during pregnancy. You can help this by drinking lots of fluids   and emptying your bladder before and after intercourse.  You may begin to get stretch marks on your hips, abdomen, and breasts. These are normal changes in the body during pregnancy. There are no exercises or medicines to take that prevent this change.  You may begin to develop swollen and bulging veins (varicose veins) in your legs. Wearing support hose, elevating your feet for 15 minutes, 3 to 4 times a day and limiting salt in your diet helps lessen the problem.  Heartburn may develop as the uterus grows and  pushes up against the stomach. Antacids recommended by your caregiver helps with this problem. Also, eating smaller meals 4 to 5 times a day helps.  Constipation can be treated with a stool softener or adding bulk to your diet. Drinking lots of fluids, and eating vegetables, fruits, and whole grains are helpful.  Exercising is also helpful. If you have been very active up until your pregnancy, most of these activities can be continued during your pregnancy. If you have been less active, it is helpful to start an exercise program such as walking.  Hemorrhoids may develop at the end of the second trimester. Warm sitz baths and hemorrhoid cream recommended by your caregiver helps hemorrhoid problems.  Backaches may develop during this time of your pregnancy. Avoid heavy lifting, wear low heal shoes, and practice good posture to help with backache problems.  Some pregnant women develop tingling and numbness of their hand and fingers because of swelling and tightening of ligaments in the wrist (carpel tunnel syndrome). This goes away after the baby is born.  As your breasts enlarge, you may have to get a bigger bra. Get a comfortable, cotton, support bra. Do not get a nursing bra until the last month of the pregnancy if you will be nursing the baby.  You may get a dark line from your belly button to the pubic area called the linea nigra.  You may develop rosy cheeks because of increase blood flow to the face.  You may develop spider looking lines of the face, neck, arms, and chest. These go away after the baby is born. HOME CARE INSTRUCTIONS   It is extremely important to avoid all smoking, herbs, alcohol, and unprescribed drugs during your pregnancy. These chemicals affect the formation and growth of the baby. Avoid these chemicals throughout the pregnancy to ensure the delivery of a healthy infant.  Most of your home care instructions are the same as suggested for the first trimester of your  pregnancy. Keep your caregiver's appointments. Follow your caregiver's instructions regarding medicine use, exercise, and diet.  During pregnancy, you are providing food for you and your baby. Continue to eat regular, well-balanced meals. Choose foods such as meat, fish, milk and other low fat dairy products, vegetables, fruits, and whole-grain breads and cereals. Your caregiver will tell you of the ideal weight gain.  A physical sexual relationship may be continued up until near the end of pregnancy if there are no other problems. Problems could include early (premature) leaking of amniotic fluid from the membranes, vaginal bleeding, abdominal pain, or other medical or pregnancy problems.  Exercise regularly if there are no restrictions. Check with your caregiver if you are unsure of the safety of some of your exercises. The greatest weight gain will occur in the last 2 trimesters of pregnancy. Exercise will help you:  Control your weight.  Get you in shape for labor and delivery.  Lose weight after you have the baby.  Wear   a good support or jogging bra for breast tenderness during pregnancy. This may help if worn during sleep. Pads or tissues may be used in the bra if you are leaking colostrum.  Do not use hot tubs, steam rooms or saunas throughout the pregnancy.  Wear your seat belt at all times when driving. This protects you and your baby if you are in an accident.  Avoid raw meat, uncooked cheese, cat litter boxes, and soil used by cats. These carry germs that can cause birth defects in the baby.  The second trimester is also a good time to visit your dentist for your dental health if this has not been done yet. Getting your teeth cleaned is okay. Use a soft toothbrush. Brush gently during pregnancy.  It is easier to leak urine during pregnancy. Tightening up and strengthening the pelvic muscles will help with this problem. Practice stopping your urination while you are going to the  bathroom. These are the same muscles you need to strengthen. It is also the muscles you would use as if you were trying to stop from passing gas. You can practice tightening these muscles up 10 times a set and repeating this about 3 times per day. Once you know what muscles to tighten up, do not perform these exercises during urination. It is more likely to contribute to an infection by backing up the urine.  Ask for help if you have financial, counseling, or nutritional needs during pregnancy. Your caregiver will be able to offer counseling for these needs as well as refer you for other special needs.  Your skin may become oily. If so, wash your face with mild soap, use non-greasy moisturizer and oil or cream based makeup. MEDICINES AND DRUG USE IN PREGNANCY  Take prenatal vitamins as directed. The vitamin should contain 1 milligram of folic acid. Keep all vitamins out of reach of children. Only a couple vitamins or tablets containing iron may be fatal to a baby or young child when ingested.  Avoid use of all medicines, including herbs, over-the-counter medicines, not prescribed or suggested by your caregiver. Only take over-the-counter or prescription medicines for pain, discomfort, or fever as directed by your caregiver. Do not use aspirin.  Let your caregiver also know about herbs you may be using.  Alcohol is related to a number of birth defects. This includes fetal alcohol syndrome. All alcohol, in any form, should be avoided completely. Smoking will cause low birth rate and premature babies.  Street or illegal drugs are very harmful to the baby. They are absolutely forbidden. A baby born to an addicted mother will be addicted at birth. The baby will go through the same withdrawal an adult does. SEEK MEDICAL CARE IF:  You have any concerns or worries during your pregnancy. It is better to call with your questions if you feel they cannot wait, rather than worry about them. SEEK IMMEDIATE  MEDICAL CARE IF:   An unexplained oral temperature above 102 F (38.9 C) develops, or as your caregiver suggests.  You have leaking of fluid from the vagina (birth canal). If leaking membranes are suspected, take your temperature and tell your caregiver of this when you call.  There is vaginal spotting, bleeding, or passing clots. Tell your caregiver of the amount and how many pads are used. Light spotting in pregnancy is common, especially following intercourse.  You develop a bad smelling vaginal discharge with a change in the color from clear to white.  You continue to feel   sick to your stomach (nauseated) and have no relief from remedies suggested. You vomit blood or coffee ground-like materials.  You lose more than 2 pounds of weight or gain more than 2 pounds of weight over 1 week, or as suggested by your caregiver.  You notice swelling of your face, hands, feet, or legs.  You get exposed to German measles and have never had them.  You are exposed to fifth disease or chickenpox.  You develop belly (abdominal) pain. Round ligament discomfort is a common non-cancerous (benign) cause of abdominal pain in pregnancy. Your caregiver still must evaluate you.  You develop a bad headache that does not go away.  You develop fever, diarrhea, pain with urination, or shortness of breath.  You develop visual problems, blurry, or double vision.  You fall or are in a car accident or any kind of trauma.  There is mental or physical violence at home. Document Released: 04/03/2001 Document Revised: 01/02/2012 Document Reviewed: 10/06/2008 [employer] Patient Information 2014 [employer].  

## 2013-02-04 ENCOUNTER — Ambulatory Visit (INDEPENDENT_AMBULATORY_CARE_PROVIDER_SITE_OTHER): Payer: BC Managed Care – PPO | Admitting: Advanced Practice Midwife

## 2013-02-04 VITALS — BP 108/83 | Wt 152.4 lb

## 2013-02-04 DIAGNOSIS — O0930 Supervision of pregnancy with insufficient antenatal care, unspecified trimester: Secondary | ICD-10-CM

## 2013-02-04 DIAGNOSIS — O0933 Supervision of pregnancy with insufficient antenatal care, third trimester: Secondary | ICD-10-CM

## 2013-02-04 LAB — POCT URINALYSIS DIP (DEVICE)
Glucose, UA: NEGATIVE mg/dL
Ketones, ur: NEGATIVE mg/dL
Protein, ur: NEGATIVE mg/dL
Specific Gravity, Urine: 1.025 (ref 1.005–1.030)
Urobilinogen, UA: 0.2 mg/dL (ref 0.0–1.0)

## 2013-02-04 LAB — CBC
HCT: 33.4 % — ABNORMAL LOW (ref 36.0–46.0)
Hemoglobin: 11.3 g/dL — ABNORMAL LOW (ref 12.0–15.0)
MCHC: 33.8 g/dL (ref 30.0–36.0)
RDW: 13.9 % (ref 11.5–15.5)
WBC: 9.4 10*3/uL (ref 4.0–10.5)

## 2013-02-04 NOTE — Progress Notes (Signed)
Doing well.  Good fetal movement, denies vaginal bleeding, LOF, regular contractions. No urinary symptoms.  Glucose screen today.

## 2013-02-04 NOTE — Progress Notes (Signed)
Pulse  114 28 week labs today 1hr due at 1052 Pt doesn't want to get the tdap today. Would like [place] to offer again at the next visit.

## 2013-02-05 LAB — HIV ANTIBODY (ROUTINE TESTING W REFLEX): HIV: NONREACTIVE

## 2013-02-05 LAB — RPR

## 2013-02-05 LAB — GLUCOSE TOLERANCE, 1 HOUR (50G) W/O FASTING: Glucose, 1 Hour GTT: 153 mg/dL — ABNORMAL HIGH (ref 70–140)

## 2013-02-10 ENCOUNTER — Ambulatory Visit (HOSPITAL_COMMUNITY)
Admission: RE | Admit: 2013-02-10 | Discharge: 2013-02-10 | Disposition: A | Discharge status: Home / Self Care | Payer: BC Managed Care – PPO | Source: Ambulatory Visit | Attending: Advanced Practice Midwife | Admitting: Advanced Practice Midwife

## 2013-02-10 ENCOUNTER — Encounter (HOSPITAL_COMMUNITY): Payer: Self-pay | Admitting: *Deleted

## 2013-02-10 ENCOUNTER — Inpatient Hospital Stay (HOSPITAL_COMMUNITY)
Admission: AD | Admit: 2013-02-10 | Discharge: 2013-02-10 | Disposition: A | Discharge status: Home / Self Care | Payer: BC Managed Care – PPO | Source: Ambulatory Visit | Attending: Obstetrics & Gynecology | Admitting: Obstetrics & Gynecology

## 2013-02-10 ENCOUNTER — Ambulatory Visit (HOSPITAL_COMMUNITY): Admission: RE | Admit: 2013-02-10 | Payer: BC Managed Care – PPO | Source: Ambulatory Visit

## 2013-02-10 ENCOUNTER — Other Ambulatory Visit: Payer: Self-pay | Admitting: Advanced Practice Midwife

## 2013-02-10 DIAGNOSIS — Z3689 Encounter for other specified antenatal screening: Secondary | ICD-10-CM | POA: Insufficient documentation

## 2013-02-10 DIAGNOSIS — O0933 Supervision of pregnancy with insufficient antenatal care, third trimester: Secondary | ICD-10-CM

## 2013-02-10 DIAGNOSIS — Z363 Encounter for antenatal screening for malformations: Secondary | ICD-10-CM | POA: Insufficient documentation

## 2013-02-10 DIAGNOSIS — O365990 Maternal care for other known or suspected poor fetal growth, unspecified trimester, not applicable or unspecified: Secondary | ICD-10-CM | POA: Insufficient documentation

## 2013-02-10 DIAGNOSIS — Z1389 Encounter for screening for other disorder: Secondary | ICD-10-CM | POA: Insufficient documentation

## 2013-02-10 NOTE — [facility] Note (Addendum)
States was told baby's stomach was measuring small. Sent to [facility] for NST. BPP 6/8, no breathing movements

## 2013-02-12 ENCOUNTER — Telehealth: Payer: Self-pay | Admitting: *Deleted

## 2013-02-12 NOTE — Telephone Encounter (Signed)
Message copied by Mannie Stabile on Thu Feb 12, 2013 10:48 AM ------      Message from: Sharen Counter A      Created: Sat Feb 07, 2013  4:29 PM       Pt had 153 on 1 hour GTT, needs 3 hour scheduled.  Thank you. ------

## 2013-02-12 NOTE — Telephone Encounter (Signed)
Spoke with patient she will arrive at 0800 on 10/29.

## 2013-02-18 ENCOUNTER — Ambulatory Visit (INDEPENDENT_AMBULATORY_CARE_PROVIDER_SITE_OTHER): Payer: BC Managed Care – PPO | Admitting: Obstetrics and Gynecology

## 2013-02-18 VITALS — BP 113/81 | Temp 97.2°F | Wt 154.7 lb

## 2013-02-18 DIAGNOSIS — O0933 Supervision of pregnancy with insufficient antenatal care, third trimester: Secondary | ICD-10-CM

## 2013-02-18 DIAGNOSIS — O0930 Supervision of pregnancy with insufficient antenatal care, unspecified trimester: Secondary | ICD-10-CM

## 2013-02-18 DIAGNOSIS — R7309 Other abnormal glucose: Secondary | ICD-10-CM

## 2013-02-18 DIAGNOSIS — O99810 Abnormal glucose complicating pregnancy: Secondary | ICD-10-CM | POA: Insufficient documentation

## 2013-02-18 LAB — POCT URINALYSIS DIP (DEVICE)
Bilirubin Urine: NEGATIVE
Glucose, UA: NEGATIVE mg/dL
Nitrite: NEGATIVE
Urobilinogen, UA: 0.2 mg/dL (ref 0.0–1.0)

## 2013-02-18 NOTE — Progress Notes (Signed)
Saw pt with PA-S. Agree. Large LE> check C&S urine

## 2013-02-18 NOTE — Progress Notes (Signed)
Pulse:109 

## 2013-02-18 NOTE — Patient Instructions (Addendum)
Pregnancy - Second Trimester The second trimester of pregnancy (3 to 6 months) is a period of rapid growth for you and your baby. At the end of the sixth month, your baby is about 9 inches long and weighs 1 1/2 pounds. You will begin to feel the baby move between 18 and 20 weeks of the pregnancy. This is called quickening. Weight gain is faster. A clear fluid (colostrum) may leak out of your breasts. You may feel small contractions of the womb (uterus). This is known as false labor or Braxton-Hicks contractions. This is like a practice for labor when the baby is ready to be born. Usually, the problems with morning sickness have usually passed by the end of your first trimester. Some women develop small dark blotches (called cholasma, mask of pregnancy) on their face that usually goes away after the baby is born. Exposure to the sun makes the blotches worse. Acne may also develop in some pregnant women and pregnant women who have acne, may find that it goes away. PRENATAL EXAMS  Blood work may continue to be done during prenatal exams. These tests are done to check on your health and the probable health of your baby. Blood work is used to follow your blood levels (hemoglobin). Anemia (low hemoglobin) is common during pregnancy. Iron and vitamins are given to help prevent this. You will also be checked for diabetes between 24 and 28 weeks of the pregnancy. Some of the previous blood tests may be repeated.  The size of the uterus is measured during each visit. This is to make sure that the baby is continuing to grow properly according to the dates of the pregnancy.  Your blood pressure is checked every prenatal visit. This is to make sure you are not getting toxemia.  Your urine is checked to make sure you do not have an infection, diabetes or protein in the urine.  Your weight is checked often to make sure gains are happening at the suggested rate. This is to ensure that both you and your baby are growing  normally.  Sometimes, an ultrasound is performed to confirm the proper growth and development of the baby. This is a test which bounces harmless sound waves off the baby so your caregiver can more accurately determine due dates. Sometimes, a test is done on the amniotic fluid surrounding the baby. This test is called an amniocentesis. The amniotic fluid is obtained by sticking a needle into the belly (abdomen). This is done to check the chromosomes in instances where there is a concern about possible genetic problems with the baby. It is also sometimes done near the end of pregnancy if an early delivery is required. In this case, it is done to help make sure the baby's lungs are mature enough for the baby to live outside of the womb. CHANGES OCCURING IN THE SECOND TRIMESTER OF PREGNANCY Your body goes through many changes during pregnancy. They vary from person to person. Talk to your caregiver about changes you notice that you are concerned about.  During the second trimester, you will likely have an increase in your appetite. It is normal to have cravings for certain foods. This varies from person to person and pregnancy to pregnancy.  Your lower abdomen will begin to bulge.  You may have to urinate more often because the uterus and baby are pressing on your bladder. It is also common to get more bladder infections during pregnancy. You can help this by drinking lots of fluids  and emptying your bladder before and after intercourse.  You may begin to get stretch marks on your hips, abdomen, and breasts. These are normal changes in the body during pregnancy. There are no exercises or medicines to take that prevent this change.  You may begin to develop swollen and bulging veins (varicose veins) in your legs. Wearing support hose, elevating your feet for 15 minutes, 3 to 4 times a day and limiting salt in your diet helps lessen the problem.  Heartburn may develop as the uterus grows and pushes up  against the stomach. Antacids recommended by your caregiver helps with this problem. Also, eating smaller meals 4 to 5 times a day helps.  Constipation can be treated with a stool softener or adding bulk to your diet. Drinking lots of fluids, and eating vegetables, fruits, and whole grains are helpful.  Exercising is also helpful. If you have been very active up until your pregnancy, most of these activities can be continued during your pregnancy. If you have been less active, it is helpful to start an exercise program such as walking.  Hemorrhoids may develop at the end of the second trimester. Warm sitz baths and hemorrhoid cream recommended by your caregiver helps hemorrhoid problems.  Backaches may develop during this time of your pregnancy. Avoid heavy lifting, wear low heal shoes, and practice good posture to help with backache problems.  Some pregnant women develop tingling and numbness of their hand and fingers because of swelling and tightening of ligaments in the wrist (carpel tunnel syndrome). This goes away after the baby is born.  As your breasts enlarge, you may have to get a bigger bra. Get a comfortable, cotton, support bra. Do not get a nursing bra until the last month of the pregnancy if you will be nursing the baby.  You may get a dark line from your belly button to the pubic area called the linea nigra.  You may develop rosy cheeks because of increase blood flow to the face.  You may develop spider looking lines of the face, neck, arms, and chest. These go away after the baby is born. HOME CARE INSTRUCTIONS   It is extremely important to avoid all smoking, herbs, alcohol, and unprescribed drugs during your pregnancy. These chemicals affect the formation and growth of the baby. Avoid these chemicals throughout the pregnancy to ensure the delivery of a healthy infant.  Most of your home care instructions are the same as suggested for the first trimester of your pregnancy.  Keep your caregiver's appointments. Follow your caregiver's instructions regarding medicine use, exercise, and diet.  During pregnancy, you are providing food for you and your baby. Continue to eat regular, well-balanced meals. Choose foods such as meat, fish, milk and other low fat dairy products, vegetables, fruits, and whole-grain breads and cereals. Your caregiver will tell you of the ideal weight gain.  A physical sexual relationship may be continued up until near the end of pregnancy if there are no other problems. Problems could include early (premature) leaking of amniotic fluid from the membranes, vaginal bleeding, abdominal pain, or other medical or pregnancy problems.  Exercise regularly if there are no restrictions. Check with your caregiver if you are unsure of the safety of some of your exercises. The greatest weight gain will occur in the last 2 trimesters of pregnancy. Exercise will help you:  Control your weight.  Get you in shape for labor and delivery.  Lose weight after you have the baby.  Wear  a good support or jogging bra for breast tenderness during pregnancy. This may help if worn during sleep. Pads or tissues may be used in the bra if you are leaking colostrum.  Do not use hot tubs, steam rooms or saunas throughout the pregnancy.  Wear your seat belt at all times when driving. This protects you and your baby if you are in an accident.  Avoid raw meat, uncooked cheese, cat litter boxes, and soil used by cats. These carry germs that can cause birth defects in the baby.  The second trimester is also a good time to visit your dentist for your dental health if this has not been done yet. Getting your teeth cleaned is okay. Use a soft toothbrush. Brush gently during pregnancy.  It is easier to leak urine during pregnancy. Tightening up and strengthening the pelvic muscles will help with this problem. Practice stopping your urination while you are going to the bathroom.  These are the same muscles you need to strengthen. It is also the muscles you would use as if you were trying to stop from passing gas. You can practice tightening these muscles up 10 times a set and repeating this about 3 times per day. Once you know what muscles to tighten up, do not perform these exercises during urination. It is more likely to contribute to an infection by backing up the urine.  Ask for help if you have financial, counseling, or nutritional needs during pregnancy. Your caregiver will be able to offer counseling for these needs as well as refer you for other special needs.  Your skin may become oily. If so, wash your face with mild soap, use non-greasy moisturizer and oil or cream based makeup. MEDICINES AND DRUG USE IN PREGNANCY  Take prenatal vitamins as directed. The vitamin should contain 1 milligram of folic acid. Keep all vitamins out of reach of children. Only a couple vitamins or tablets containing iron may be fatal to a baby or young child when ingested.  Avoid use of all medicines, including herbs, over-the-counter medicines, not prescribed or suggested by your caregiver. Only take over-the-counter or prescription medicines for pain, discomfort, or fever as directed by your caregiver. Do not use aspirin.  Let your caregiver also know about herbs you may be using.  Alcohol is related to a number of birth defects. This includes fetal alcohol syndrome. All alcohol, in any form, should be avoided completely. Smoking will cause low birth rate and premature babies.  Street or illegal drugs are very harmful to the baby. They are absolutely forbidden. A baby born to an addicted mother will be addicted at birth. The baby will go through the same withdrawal an adult does. SEEK MEDICAL CARE IF:  You have any concerns or worries during your pregnancy. It is better to call with your questions if you feel they cannot wait, rather than worry about them. SEEK IMMEDIATE MEDICAL CARE  IF:   An unexplained oral temperature above 102 F (38.9 C) develops, or as your caregiver suggests.  You have leaking of fluid from the vagina (birth canal). If leaking membranes are suspected, take your temperature and tell your caregiver of this when you call.  There is vaginal spotting, bleeding, or passing clots. Tell your caregiver of the amount and how many pads are used. Light spotting in pregnancy is common, especially following intercourse.  You develop a bad smelling vaginal discharge with a change in the color from clear to white.  You continue to feel  sick to your stomach (nauseated) and have no relief from remedies suggested. You vomit blood or coffee ground-like materials.  You lose more than 2 pounds of weight or gain more than 2 pounds of weight over 1 week, or as suggested by your caregiver.  You notice swelling of your face, hands, feet, or legs.  You get exposed to [place] measles and have never had them.  You are exposed to fifth disease or chickenpox.  You develop belly (abdominal) pain. Round ligament discomfort is a common non-cancerous (benign) cause of abdominal pain in pregnancy. Your caregiver still must evaluate you.  You develop a bad headache that does not go away.  You develop fever, diarrhea, pain with urination, or shortness of breath.  You develop visual problems, blurry, or double vision.  You fall or are in a car accident or any kind of trauma.  There is mental or physical violence at home. Document Released: 04/03/2001 Document Revised: 01/02/2012 Document Reviewed: 10/06/2008 [facility] Patient Information 2014 [place], [place]. Contraception Choices Contraception (birth control) is the use of any methods or devices to prevent pregnancy. Below are some methods to help avoid pregnancy. HORMONAL METHODS   Contraceptive implant. This is a thin, plastic tube containing progesterone hormone. It does not contain estrogen hormone. Your caregiver  inserts the tube in the inner part of the upper arm. The tube can remain in place for up to 3 years. After 3 years, the implant must be removed. The implant prevents the ovaries from releasing an egg (ovulation), thickens the cervical mucus which prevents sperm from entering the uterus, and thins the lining of the inside of the uterus.  Progesterone-only injections. These injections are given every 3 months by your caregiver to prevent pregnancy. This synthetic progesterone hormone stops the ovaries from releasing eggs. It also thickens cervical mucus and changes the uterine lining. This makes it harder for sperm to survive in the uterus.  Birth control pills. These pills contain estrogen and progesterone hormone. They work by stopping the egg from forming in the ovary (ovulation). Birth control pills are prescribed by a caregiver.Birth control pills can also be used to treat heavy periods.  Minipill. This type of birth control pill contains only the progesterone hormone. They are taken every day of each month and must be prescribed by your caregiver.  Birth control patch. The patch contains hormones similar to those in birth control pills. It must be changed once a week and is prescribed by a caregiver.  Vaginal ring. The ring contains hormones similar to those in birth control pills. It is left in the vagina for 3 weeks, removed for 1 week, and then a new one is put back in place. The patient must be comfortable inserting and removing the ring from the vagina.A caregiver's prescription is necessary.  Emergency contraception. Emergency contraceptives prevent pregnancy after unprotected sexual intercourse. This pill can be taken right after sex or up to 5 days after unprotected sex. It is most effective the sooner you take the pills after having sexual intercourse. Emergency contraceptive pills are available without a prescription. Check with your pharmacist. Do not use emergency contraception as your  only form of birth control. BARRIER METHODS   Female condom. This is a thin sheath (latex or rubber) that is worn over the penis during sexual intercourse. It can be used with spermicide to increase effectiveness.  Female condom. This is a soft, loose-fitting sheath that is put into the vagina before sexual intercourse.  Diaphragm. This is  a soft, latex, dome-shaped barrier that must be fitted by a caregiver. It is inserted into the vagina, along with a spermicidal jelly. It is inserted before intercourse. The diaphragm should be left in the vagina for 6 to 8 hours after intercourse.  Cervical cap. This is a round, soft, latex or plastic cup that fits over the cervix and must be fitted by a caregiver. The cap can be left in place for up to 48 hours after intercourse.  Sponge. This is a soft, circular piece of polyurethane foam. The sponge has spermicide in it. It is inserted into the vagina after wetting it and before sexual intercourse.  Spermicides. These are chemicals that kill or block sperm from entering the cervix and uterus. They come in the form of creams, jellies, suppositories, foam, or tablets. They do not require a prescription. They are inserted into the vagina with an applicator before having sexual intercourse. The process must be repeated every time you have sexual intercourse. INTRAUTERINE CONTRACEPTION  Intrauterine device (IUD). This is a T-shaped device that is put in a woman's uterus during a menstrual period to prevent pregnancy. There are 2 types:  Copper IUD. This type of IUD is wrapped in copper wire and is placed inside the uterus. Copper makes the uterus and fallopian tubes produce a fluid that kills sperm. It can stay in place for 10 years.  Hormone IUD. This type of IUD contains the hormone progestin (synthetic progesterone). The hormone thickens the cervical mucus and prevents sperm from entering the uterus, and it also thins the uterine lining to prevent implantation  of a fertilized egg. The hormone can weaken or kill the sperm that get into the uterus. It can stay in place for 5 years. PERMANENT METHODS OF CONTRACEPTION  Female tubal ligation. This is when the woman's fallopian tubes are surgically sealed, tied, or blocked to prevent the egg from traveling to the uterus.  Female sterilization. This is when the female has the tubes that carry sperm tied off (vasectomy).This blocks sperm from entering the vagina during sexual intercourse. After the procedure, the man can still ejaculate fluid (semen). NATURAL PLANNING METHODS  Natural family planning. This is not having sexual intercourse or using a barrier method (condom, diaphragm, cervical cap) on days the woman could become pregnant.  Calendar method. This is keeping track of the length of each menstrual cycle and identifying when you are fertile.  Ovulation method. This is avoiding sexual intercourse during ovulation.  Symptothermal method. This is avoiding sexual intercourse during ovulation, using a thermometer and ovulation symptoms.  Post-ovulation method. This is timing sexual intercourse after you have ovulated. Regardless of which type or method of contraception you choose, it is important that you use condoms to protect against the transmission of sexually transmitted diseases (STDs). Talk with your caregiver about which form of contraception is most appropriate for you. Document Released: 04/09/2005 Document Revised: 07/02/2011 Document Reviewed: 08/16/2010 [facility] Patient Information 2014 [place], [place].

## 2013-02-18 NOTE — Progress Notes (Signed)
Pt. Is feeling well and has no complaints. Plans to breastfeed, is unsure about post-partum contraception but wants more information on it.

## 2013-02-18 NOTE — Addendum Note (Signed)
Addended by: Franchot Mimes on: 02/18/2013 11:47 AM   Modules accepted: Orders

## 2013-02-19 LAB — GLUCOSE TOLERANCE, 3 HOURS
Glucose Tolerance, 1 hour: 131 mg/dL (ref 70–189)
Glucose Tolerance, 2 hour: 134 mg/dL (ref 70–164)
Glucose Tolerance, Fasting: 75 mg/dL (ref 70–104)

## 2013-03-04 ENCOUNTER — Ambulatory Visit (INDEPENDENT_AMBULATORY_CARE_PROVIDER_SITE_OTHER): Payer: BC Managed Care – PPO | Admitting: Obstetrics and Gynecology

## 2013-03-04 ENCOUNTER — Encounter: Payer: Self-pay | Admitting: Obstetrics and Gynecology

## 2013-03-04 VITALS — BP 92/60 | Temp 97.9°F | Wt 156.2 lb

## 2013-03-04 DIAGNOSIS — H6591 Unspecified nonsuppurative otitis media, right ear: Secondary | ICD-10-CM | POA: Insufficient documentation

## 2013-03-04 DIAGNOSIS — O99810 Abnormal glucose complicating pregnancy: Secondary | ICD-10-CM

## 2013-03-04 DIAGNOSIS — H6590 Unspecified nonsuppurative otitis media, unspecified ear: Secondary | ICD-10-CM

## 2013-03-04 DIAGNOSIS — Z3402 Encounter for supervision of normal first pregnancy, second trimester: Secondary | ICD-10-CM

## 2013-03-04 DIAGNOSIS — Z23 Encounter for immunization: Secondary | ICD-10-CM

## 2013-03-04 LAB — POCT URINALYSIS DIP (DEVICE)
Bilirubin Urine: NEGATIVE
Glucose, UA: NEGATIVE mg/dL
Hgb urine dipstick: NEGATIVE
Ketones, ur: NEGATIVE mg/dL
Specific Gravity, Urine: 1.025 (ref 1.005–1.030)
pH: 6.5 (ref 5.0–8.0)

## 2013-03-04 MED ORDER — TETANUS-DIPHTH-ACELL PERTUSSIS 5-2.5-18.5 LF-MCG/0.5 IM SUSP: 0.5000 mL | Freq: Once | INTRAMUSCULAR | Status: DC

## 2013-03-04 NOTE — Progress Notes (Signed)
Doing well. Explained flu vaccine. Will get TDaP. Discussed plans and encouraged classes. Info on contraception. UR congestion, postnasal drip

## 2013-03-04 NOTE — Progress Notes (Signed)
P=  Pt is undecided about flu vaccine, consent for Tdap

## 2013-03-04 NOTE — Patient Instructions (Signed)
Contraception Choices Contraception (birth control) is the use of any methods or devices to prevent pregnancy. Below are some methods to help avoid pregnancy. HORMONAL METHODS   Contraceptive implant This is a thin, plastic tube containing progesterone hormone. It does not contain estrogen hormone. Your health care provider inserts the tube in the inner part of the upper arm. The tube can remain in place for up to 3 years. After 3 years, the implant must be removed. The implant prevents the ovaries from releasing an egg (ovulation), thickens the cervical mucus to prevent sperm from entering the uterus, and thins the lining of the inside of the uterus.  Progesterone-only injections These injections are given every 3 months by your health care provider to prevent pregnancy. This synthetic progesterone hormone stops the ovaries from releasing eggs. It also thickens cervical mucus and changes the uterine lining. This makes it harder for sperm to survive in the uterus.  Birth control pills These pills contain estrogen and progesterone hormone. They work by preventing the ovaries from releasing eggs (ovulation). They also cause the cervical mucus to thicken, preventing the sperm from entering the uterus. Birth control pills are prescribed by a health care provider.Birth control pills can also be used to treat heavy periods.  Minipill This type of birth control pill contains only the progesterone hormone. They are taken every day of each month and must be prescribed by your health care provider.  Birth control patch The patch contains hormones similar to those in birth control pills. It must be changed once a week and is prescribed by a health care provider.  Vaginal ring The ring contains hormones similar to those in birth control pills. It is left in the vagina for 3 weeks, removed for 1 week, and then a new one is put back in place. The patient must be comfortable inserting and removing the ring from the  vagina.A health care provider's prescription is necessary.  Emergency contraception Emergency contraceptives prevent pregnancy after unprotected sexual intercourse. This pill can be taken right after sex or up to 5 days after unprotected sex. It is most effective the sooner you take the pills after having sexual intercourse. Most emergency contraceptive pills are available without a prescription. Check with your pharmacist. Do not use emergency contraception as your only form of birth control. BARRIER METHODS   Female condom This is a thin sheath (latex or rubber) that is worn over the penis during sexual intercourse. It can be used with spermicide to increase effectiveness.  Female condom. This is a soft, loose-fitting sheath that is put into the vagina before sexual intercourse.  Diaphragm This is a soft, latex, dome-shaped barrier that must be fitted by a health care provider. It is inserted into the vagina, along with a spermicidal jelly. It is inserted before intercourse. The diaphragm should be left in the vagina for 6 to 8 hours after intercourse.  Cervical cap This is a round, soft, latex or plastic cup that fits over the cervix and must be fitted by a health care provider. The cap can be left in place for up to 48 hours after intercourse.  Sponge This is a soft, circular piece of polyurethane foam. The sponge has spermicide in it. It is inserted into the vagina after wetting it and before sexual intercourse.  Spermicides These are chemicals that kill or block sperm from entering the cervix and uterus. They come in the form of creams, jellies, suppositories, foam, or tablets. They do not require a   prescription. They are inserted into the vagina with an applicator before having sexual intercourse. The process must be repeated every time you have sexual intercourse. INTRAUTERINE CONTRACEPTION  Intrauterine device (IUD) This is a T-shaped device that is put in a woman's uterus during a  menstrual period to prevent pregnancy. There are 2 types:  Copper IUD This type of IUD is wrapped in copper wire and is placed inside the uterus. Copper makes the uterus and fallopian tubes produce a fluid that kills sperm. It can stay in place for 10 years.  Hormone IUD This type of IUD contains the hormone progestin (synthetic progesterone). The hormone thickens the cervical mucus and prevents sperm from entering the uterus, and it also thins the uterine lining to prevent implantation of a fertilized egg. The hormone can weaken or kill the sperm that get into the uterus. It can stay in place for 3 5 years, depending on which type of IUD is used. PERMANENT METHODS OF CONTRACEPTION  Female tubal ligation This is when the woman's fallopian tubes are surgically sealed, tied, or blocked to prevent the egg from traveling to the uterus.  Hysteroscopic sterilization This involves placing a small coil or insert into each fallopian tube. Your doctor uses a technique called hysteroscopy to do the procedure. The device causes scar tissue to form. This results in permanent blockage of the fallopian tubes, so the sperm cannot fertilize the egg. It takes about 3 months after the procedure for the tubes to become blocked. You must use another form of birth control for these 3 months.  Female sterilization This is when the female has the tubes that carry sperm tied off (vasectomy).This blocks sperm from entering the vagina during sexual intercourse. After the procedure, the man can still ejaculate fluid (semen). NATURAL PLANNING METHODS  Natural family planning This is not having sexual intercourse or using a barrier method (condom, diaphragm, cervical cap) on days the woman could become pregnant.  Calendar method This is keeping track of the length of each menstrual cycle and identifying when you are fertile.  Ovulation method This is avoiding sexual intercourse during ovulation.  Symptothermal method This is  avoiding sexual intercourse during ovulation, using a thermometer and ovulation symptoms.  Post ovulation method This is timing sexual intercourse after you have ovulated. Regardless of which type or method of contraception you choose, it is important that you use condoms to protect against the transmission of sexually transmitted infections (STIs). Talk with your health care provider about which form of contraception is most appropriate for you. Document Released: 04/09/2005 Document Revised: 12/10/2012 Document Reviewed: 10/02/2012 [employer] Patient Information 2014 [employer].  

## 2013-03-10 ENCOUNTER — Emergency Department (HOSPITAL_COMMUNITY)
Admission: EM | Admit: 2013-03-10 | Discharge: 2013-03-11 | Disposition: A | Discharge status: Home / Self Care | Payer: BC Managed Care – PPO | Attending: Emergency Medicine | Admitting: Emergency Medicine

## 2013-03-10 ENCOUNTER — Encounter (HOSPITAL_COMMUNITY): Payer: Self-pay | Admitting: Emergency Medicine

## 2013-03-10 DIAGNOSIS — H9190 Unspecified hearing loss, unspecified ear: Secondary | ICD-10-CM | POA: Insufficient documentation

## 2013-03-10 DIAGNOSIS — Z87891 Personal history of nicotine dependence: Secondary | ICD-10-CM | POA: Insufficient documentation

## 2013-03-10 DIAGNOSIS — H7292 Unspecified perforation of tympanic membrane, left ear: Secondary | ICD-10-CM

## 2013-03-10 DIAGNOSIS — Z789 Other specified health status: Secondary | ICD-10-CM | POA: Insufficient documentation

## 2013-03-10 DIAGNOSIS — J45909 Unspecified asthma, uncomplicated: Secondary | ICD-10-CM | POA: Insufficient documentation

## 2013-03-10 DIAGNOSIS — H7290 Unspecified perforation of tympanic membrane, unspecified ear: Secondary | ICD-10-CM | POA: Insufficient documentation

## 2013-03-10 NOTE — ED Notes (Signed)
Pt presents with c/o left ear pain. Pt says the pain started last night.

## 2013-03-11 NOTE — ED Provider Notes (Signed)
CSN: [id number]     Arrival date & time 03/10/13  2313 History   First MD Initiated Contact with Patient 03/10/13 2353     Chief Complaint  Patient presents with  . Otalgia   (Consider location/radiation/quality/duration/timing/severity/associated sxs/prior Treatment) HPI Comments: Had sudden hearing loss and pain after cleaning her ears with a Q-tip last night   Patient is a 22 y.o. female presenting with ear pain. The history is provided by the patient.  Otalgia Location:  Left Quality:  Unable to specify Severity:  Mild Onset quality:  Sudden Duration:  1 day Timing:  Constant Progression:  Unable to specify Chronicity:  New Context comment:  Was cleaning her ears with a Qtip  Relieved by:  None tried Worsened by:  Nothing tried Ineffective treatments:  None tried Associated symptoms: hearing loss   Associated symptoms: no ear discharge, no fever, no headaches, no rhinorrhea and no sore throat   Risk factors: no chronic ear infection and no prior ear surgery     Past Medical History  Diagnosis Date  . Asthma   . Seasonal allergic rhinitis     spring  . Wears glasses    History reviewed. No pertinent past surgical history. Family History  Problem Relation Age of Onset  . Hypertension Mother   . Other Father     unknown  . Cancer Maternal Aunt     lung  . Heart disease Neg Hx   . Diabetes Neg Hx   . Stroke Neg Hx    History  Substance Use Topics  . Smoking status: Former Games developer  . Smokeless tobacco: Never Used  . Alcohol Use: Yes     Comment: sometimes   OB History   Grav Para Term Preterm Abortions TAB SAB Ect Mult Living   1              Review of Systems  Constitutional: Negative for fever and chills.  HENT: Positive for ear pain and hearing loss. Negative for ear discharge, facial swelling, rhinorrhea and sore throat.   Neurological: Negative for dizziness and headaches.  All other systems reviewed and are negative.    Allergies  Review of  patient's allergies indicates no known allergies.  Home Medications  No current outpatient prescriptions on file. BP 122/75  Pulse 75  Temp(Src) 97.8 F (36.6 C) (Oral)  Resp 20  Ht 5\' 4"  (1.626 m)  Wt 154 lb (69.854 kg)  BMI 26.42 kg/m2  SpO2 100%  LMP 06/20/2012 Physical Exam  Vitals reviewed. Constitutional: She is oriented to person, place, and time. She appears well-developed and well-nourished.  HENT:  Head: Normocephalic and atraumatic.  Right Ear: External ear normal.  Left Ear: No lacerations. No drainage, swelling or tenderness. Tympanic membrane is perforated. Decreased hearing is noted.  Mouth/Throat: Oropharynx is clear and moist.  Eyes: Pupils are equal, round, and reactive to light.  Neck: Normal range of motion.  Cardiovascular: Normal rate.   Pulmonary/Chest: Effort normal.  Musculoskeletal: Normal range of motion.  Neurological: She is alert and oriented to person, place, and time.  Skin: Skin is warm.    ED Course  Procedures (including critical care time) Labs Review Labs Reviewed - No data to display Imaging Review No results found.  EKG Interpretation   None       MDM   1. Ruptured ear drum, left    Patient is to schedule a follow up appointment with DR. Lynnae January, NP  03/11/13 0021 

## 2013-03-11 NOTE — ED Provider Notes (Signed)
Medical screening examination/treatment/procedure(s) were performed by non-physician practitioner and as supervising physician I was immediately available for consultation/collaboration.   Loren Racer, MD 03/11/13 [phone]

## 2013-03-12 ENCOUNTER — Encounter: Payer: Self-pay | Admitting: *Deleted

## 2013-03-25 ENCOUNTER — Encounter: Payer: Self-pay | Admitting: Advanced Practice Midwife

## 2013-03-25 ENCOUNTER — Ambulatory Visit (INDEPENDENT_AMBULATORY_CARE_PROVIDER_SITE_OTHER): Payer: BC Managed Care – PPO | Admitting: Advanced Practice Midwife

## 2013-03-25 VITALS — BP 121/86 | Wt 161.2 lb

## 2013-03-25 DIAGNOSIS — O0930 Supervision of pregnancy with insufficient antenatal care, unspecified trimester: Secondary | ICD-10-CM

## 2013-03-25 LAB — POCT URINALYSIS DIP (DEVICE)
Bilirubin Urine: NEGATIVE
Hgb urine dipstick: NEGATIVE
Ketones, ur: NEGATIVE mg/dL
Specific Gravity, Urine: 1.02 (ref 1.005–1.030)
pH: 7 (ref 5.0–8.0)

## 2013-03-25 NOTE — Patient Instructions (Signed)
Third Trimester of Pregnancy  The third trimester is from week 29 through week 42, months 7 through 9. The third trimester is a time when the fetus is growing rapidly. At the end of the ninth month, the fetus is about 20 inches in length and weighs 6 10 pounds.   BODY CHANGES  Your body goes through many changes during pregnancy. The changes vary from woman to woman.    Your weight will continue to increase. You can expect to gain 25 35 pounds (11 16 kg) by the end of the pregnancy.   You may begin to get stretch marks on your hips, abdomen, and breasts.   You may urinate more often because the fetus is moving lower into your pelvis and pressing on your bladder.   You may develop or continue to have heartburn as a result of your pregnancy.   You may develop constipation because certain hormones are causing the muscles that push waste through your intestines to slow down.   You may develop hemorrhoids or swollen, bulging veins (varicose veins).   You may have pelvic pain because of the weight gain and pregnancy hormones relaxing your joints between the bones in your pelvis. Back aches may result from over exertion of the muscles supporting your posture.   Your breasts will continue to grow and be tender. A yellow discharge may leak from your breasts called colostrum.   Your belly button may stick out.   You may feel short of breath because of your expanding uterus.   You may notice the fetus "dropping," or moving lower in your abdomen.   You may have a bloody mucus discharge. This usually occurs a few days to a week before labor begins.   Your cervix becomes thin and soft (effaced) near your due date.  WHAT TO EXPECT AT YOUR PRENATAL EXAMS   You will have prenatal exams every 2 weeks until week 36. Then, you will have weekly prenatal exams. During a routine prenatal visit:   You will be weighed to make sure you and the fetus are growing normally.   Your blood pressure is taken.   Your abdomen will be  measured to track your baby's growth.   The fetal heartbeat will be listened to.   Any test results from the previous visit will be discussed.   You may have a cervical check near your due date to see if you have effaced.  At around 36 weeks, your caregiver will check your cervix. At the same time, your caregiver will also perform a test on the secretions of the vaginal tissue. This test is to determine if a type of bacteria, Group B streptococcus, is present. Your caregiver will explain this further.  Your caregiver may ask you:   What your birth plan is.   How you are feeling.   If you are feeling the baby move.   If you have had any abnormal symptoms, such as leaking fluid, bleeding, severe headaches, or abdominal cramping.   If you have any questions.  Other tests or screenings that may be performed during your third trimester include:   Blood tests that check for low iron levels (anemia).   Fetal testing to check the health, activity level, and growth of the fetus. Testing is done if you have certain medical conditions or if there are problems during the pregnancy.  FALSE LABOR  You may feel small, irregular contractions that eventually go away. These are called Braxton Hicks contractions, or   false labor. Contractions may last for hours, days, or even weeks before true labor sets in. If contractions come at regular intervals, intensify, or become painful, it is best to be seen by your caregiver.   SIGNS OF LABOR    Menstrual-like cramps.   Contractions that are 5 minutes apart or less.   Contractions that start on the top of the uterus and spread down to the lower abdomen and back.   A sense of increased pelvic pressure or back pain.   A watery or bloody mucus discharge that comes from the vagina.  If you have any of these signs before the 37th week of pregnancy, call your caregiver right away. You need to go to the hospital to get checked immediately.  HOME CARE INSTRUCTIONS    Avoid all  smoking, herbs, alcohol, and unprescribed drugs. These chemicals affect the formation and growth of the baby.   Follow your caregiver's instructions regarding medicine use. There are medicines that are either safe or unsafe to take during pregnancy.   Exercise only as directed by your caregiver. Experiencing uterine cramps is a good sign to stop exercising.   Continue to eat regular, healthy meals.   Wear a good support bra for breast tenderness.   Do not use hot tubs, steam rooms, or saunas.   Wear your seat belt at all times when driving.   Avoid raw meat, uncooked cheese, cat litter boxes, and soil used by cats. These carry germs that can cause birth defects in the baby.   Take your prenatal vitamins.   Try taking a stool softener (if your caregiver approves) if you develop constipation. Eat more high-fiber foods, such as fresh vegetables or fruit and whole grains. Drink plenty of fluids to keep your urine clear or pale yellow.   Take warm sitz baths to soothe any pain or discomfort caused by hemorrhoids. Use hemorrhoid cream if your caregiver approves.   If you develop varicose veins, wear support hose. Elevate your feet for 15 minutes, 3 4 times a day. Limit salt in your diet.   Avoid heavy lifting, wear low heal shoes, and practice good posture.   Rest a lot with your legs elevated if you have leg cramps or low back pain.   Visit your dentist if you have not gone during your pregnancy. Use a soft toothbrush to brush your teeth and be gentle when you floss.   A sexual relationship may be continued unless your caregiver directs you otherwise.   Do not travel far distances unless it is absolutely necessary and only with the approval of your caregiver.   Take prenatal classes to understand, practice, and ask questions about the labor and delivery.   Make a trial run to the hospital.   Pack your hospital bag.   Prepare the baby's nursery.   Continue to go to all your prenatal visits as directed  by your caregiver.  SEEK MEDICAL CARE IF:   You are unsure if you are in labor or if your water has broken.   You have dizziness.   You have mild pelvic cramps, pelvic pressure, or nagging pain in your abdominal area.   You have persistent nausea, vomiting, or diarrhea.   You have a bad smelling vaginal discharge.   You have pain with urination.  SEEK IMMEDIATE MEDICAL CARE IF:    You have a fever.   You are leaking fluid from your vagina.   You have spotting or bleeding from your vagina.     You have severe abdominal cramping or pain.   You have rapid weight loss or gain.   You have shortness of breath with chest pain.   You notice sudden or extreme swelling of your face, hands, ankles, feet, or legs.   You have not felt your baby move in over an hour.   You have severe headaches that do not go away with medicine.   You have vision changes.  Document Released: 04/03/2001 Document Revised: 12/10/2012 Document Reviewed: 06/10/2012  [employer] Patient Information 2014 [employer].

## 2013-03-25 NOTE — Progress Notes (Signed)
Pulse: 115 

## 2013-03-25 NOTE — Progress Notes (Signed)
Cultures at next visit. 

## 2013-04-01 ENCOUNTER — Encounter: Payer: Self-pay | Admitting: Obstetrics and Gynecology

## 2013-04-01 ENCOUNTER — Ambulatory Visit (INDEPENDENT_AMBULATORY_CARE_PROVIDER_SITE_OTHER): Payer: BC Managed Care – PPO | Admitting: Obstetrics and Gynecology

## 2013-04-01 VITALS — BP 101/74 | Wt 158.9 lb

## 2013-04-01 DIAGNOSIS — O0933 Supervision of pregnancy with insufficient antenatal care, third trimester: Secondary | ICD-10-CM

## 2013-04-01 DIAGNOSIS — O99810 Abnormal glucose complicating pregnancy: Secondary | ICD-10-CM

## 2013-04-01 DIAGNOSIS — O0930 Supervision of pregnancy with insufficient antenatal care, unspecified trimester: Secondary | ICD-10-CM

## 2013-04-01 LAB — OB RESULTS CONSOLE GBS: GBS: NEGATIVE

## 2013-04-01 NOTE — Addendum Note (Signed)
Addended by: Louanna Raw on: 04/01/2013 04:30 PM   Modules accepted: Orders

## 2013-04-01 NOTE — Progress Notes (Signed)
Cultures done. Doing well. LARC encouraged.

## 2013-04-01 NOTE — Progress Notes (Signed)
Pulse: 119  

## 2013-04-01 NOTE — Patient Instructions (Signed)
Contraception Choices Contraception (birth control) is the use of any methods or devices to prevent pregnancy. Below are some methods to help avoid pregnancy. HORMONAL METHODS   Contraceptive implant This is a thin, plastic tube containing progesterone hormone. It does not contain estrogen hormone. Your health care provider inserts the tube in the inner part of the upper arm. The tube can remain in place for up to 3 years. After 3 years, the implant must be removed. The implant prevents the ovaries from releasing an egg (ovulation), thickens the cervical mucus to prevent sperm from entering the uterus, and thins the lining of the inside of the uterus.  Progesterone-only injections These injections are given every 3 months by your health care provider to prevent pregnancy. This synthetic progesterone hormone stops the ovaries from releasing eggs. It also thickens cervical mucus and changes the uterine lining. This makes it harder for sperm to survive in the uterus.  Birth control pills These pills contain estrogen and progesterone hormone. They work by preventing the ovaries from releasing eggs (ovulation). They also cause the cervical mucus to thicken, preventing the sperm from entering the uterus. Birth control pills are prescribed by a health care provider.Birth control pills can also be used to treat heavy periods.  Minipill This type of birth control pill contains only the progesterone hormone. They are taken every day of each month and must be prescribed by your health care provider.  Birth control patch The patch contains hormones similar to those in birth control pills. It must be changed once a week and is prescribed by a health care provider.  Vaginal ring The ring contains hormones similar to those in birth control pills. It is left in the vagina for 3 weeks, removed for 1 week, and then a new one is put back in place. The patient must be comfortable inserting and removing the ring from the  vagina.A health care provider's prescription is necessary.  Emergency contraception Emergency contraceptives prevent pregnancy after unprotected sexual intercourse. This pill can be taken right after sex or up to 5 days after unprotected sex. It is most effective the sooner you take the pills after having sexual intercourse. Most emergency contraceptive pills are available without a prescription. Check with your pharmacist. Do not use emergency contraception as your only form of birth control. BARRIER METHODS   Female condom This is a thin sheath (latex or rubber) that is worn over the penis during sexual intercourse. It can be used with spermicide to increase effectiveness.  Female condom. This is a soft, loose-fitting sheath that is put into the vagina before sexual intercourse.  Diaphragm This is a soft, latex, dome-shaped barrier that must be fitted by a health care provider. It is inserted into the vagina, along with a spermicidal jelly. It is inserted before intercourse. The diaphragm should be left in the vagina for 6 to 8 hours after intercourse.  Cervical cap This is a round, soft, latex or plastic cup that fits over the cervix and must be fitted by a health care provider. The cap can be left in place for up to 48 hours after intercourse.  Sponge This is a soft, circular piece of polyurethane foam. The sponge has spermicide in it. It is inserted into the vagina after wetting it and before sexual intercourse.  Spermicides These are chemicals that kill or block sperm from entering the cervix and uterus. They come in the form of creams, jellies, suppositories, foam, or tablets. They do not require a   prescription. They are inserted into the vagina with an applicator before having sexual intercourse. The process must be repeated every time you have sexual intercourse. INTRAUTERINE CONTRACEPTION  Intrauterine device (IUD) This is a T-shaped device that is put in a woman's uterus during a  menstrual period to prevent pregnancy. There are 2 types:  Copper IUD This type of IUD is wrapped in copper wire and is placed inside the uterus. Copper makes the uterus and fallopian tubes produce a fluid that kills sperm. It can stay in place for 10 years.  Hormone IUD This type of IUD contains the hormone progestin (synthetic progesterone). The hormone thickens the cervical mucus and prevents sperm from entering the uterus, and it also thins the uterine lining to prevent implantation of a fertilized egg. The hormone can weaken or kill the sperm that get into the uterus. It can stay in place for 3 5 years, depending on which type of IUD is used. PERMANENT METHODS OF CONTRACEPTION  Female tubal ligation This is when the woman's fallopian tubes are surgically sealed, tied, or blocked to prevent the egg from traveling to the uterus.  Hysteroscopic sterilization This involves placing a small coil or insert into each fallopian tube. Your doctor uses a technique called hysteroscopy to do the procedure. The device causes scar tissue to form. This results in permanent blockage of the fallopian tubes, so the sperm cannot fertilize the egg. It takes about 3 months after the procedure for the tubes to become blocked. You must use another form of birth control for these 3 months.  Female sterilization This is when the female has the tubes that carry sperm tied off (vasectomy).This blocks sperm from entering the vagina during sexual intercourse. After the procedure, the man can still ejaculate fluid (semen). NATURAL PLANNING METHODS  Natural family planning This is not having sexual intercourse or using a barrier method (condom, diaphragm, cervical cap) on days the woman could become pregnant.  Calendar method This is keeping track of the length of each menstrual cycle and identifying when you are fertile.  Ovulation method This is avoiding sexual intercourse during ovulation.  Symptothermal method This is  avoiding sexual intercourse during ovulation, using a thermometer and ovulation symptoms.  Post ovulation method This is timing sexual intercourse after you have ovulated. Regardless of which type or method of contraception you choose, it is important that you use condoms to protect against the transmission of sexually transmitted infections (STIs). Talk with your health care provider about which form of contraception is most appropriate for you. Document Released: 04/09/2005 Document Revised: 12/10/2012 Document Reviewed: 10/02/2012 [employer] Patient Information 2014 [employer].  

## 2013-04-02 LAB — GC/CHLAMYDIA PROBE AMP: GC Probe RNA: NEGATIVE

## 2013-04-07 ENCOUNTER — Encounter: Payer: Self-pay | Admitting: Family Medicine

## 2013-04-07 ENCOUNTER — Ambulatory Visit (INDEPENDENT_AMBULATORY_CARE_PROVIDER_SITE_OTHER): Payer: BC Managed Care – PPO | Admitting: Family Medicine

## 2013-04-07 VITALS — BP 120/81 | Temp 97.6°F | Wt 159.4 lb

## 2013-04-07 DIAGNOSIS — O0933 Supervision of pregnancy with insufficient antenatal care, third trimester: Secondary | ICD-10-CM

## 2013-04-07 DIAGNOSIS — O0930 Supervision of pregnancy with insufficient antenatal care, unspecified trimester: Secondary | ICD-10-CM

## 2013-04-07 LAB — POCT URINALYSIS DIP (DEVICE)
Bilirubin Urine: NEGATIVE
Glucose, UA: NEGATIVE mg/dL
Ketones, ur: NEGATIVE mg/dL
Specific Gravity, Urine: 1.015 (ref 1.005–1.030)
Urobilinogen, UA: 0.2 mg/dL (ref 0.0–1.0)

## 2013-04-07 NOTE — Progress Notes (Signed)
P-100 

## 2013-04-07 NOTE — Patient Instructions (Signed)
Third Trimester of Pregnancy  The third trimester is from week 29 through week 42, months 7 through 9. The third trimester is a time when the fetus is growing rapidly. At the end of the ninth month, the fetus is about 20 inches in length and weighs 6 10 pounds.   BODY CHANGES  Your body goes through many changes during pregnancy. The changes vary from woman to woman.    Your weight will continue to increase. You can expect to gain 25 35 pounds (11 16 kg) by the end of the pregnancy.   You may begin to get stretch marks on your hips, abdomen, and breasts.   You may urinate more often because the fetus is moving lower into your pelvis and pressing on your bladder.   You may develop or continue to have heartburn as a result of your pregnancy.   You may develop constipation because certain hormones are causing the muscles that push waste through your intestines to slow down.   You may develop hemorrhoids or swollen, bulging veins (varicose veins).   You may have pelvic pain because of the weight gain and pregnancy hormones relaxing your joints between the bones in your pelvis. Back aches may result from over exertion of the muscles supporting your posture.   Your breasts will continue to grow and be tender. A yellow discharge may leak from your breasts called colostrum.   Your belly button may stick out.   You may feel short of breath because of your expanding uterus.   You may notice the fetus "dropping," or moving lower in your abdomen.   You may have a bloody mucus discharge. This usually occurs a few days to a week before labor begins.   Your cervix becomes thin and soft (effaced) near your due date.  WHAT TO EXPECT AT YOUR PRENATAL EXAMS   You will have prenatal exams every 2 weeks until week 36. Then, you will have weekly prenatal exams. During a routine prenatal visit:   You will be weighed to make sure you and the fetus are growing normally.   Your blood pressure is taken.   Your abdomen will be  measured to track your baby's growth.   The fetal heartbeat will be listened to.   Any test results from the previous visit will be discussed.   You may have a cervical check near your due date to see if you have effaced.  At around 36 weeks, your caregiver will check your cervix. At the same time, your caregiver will also perform a test on the secretions of the vaginal tissue. This test is to determine if a type of bacteria, Group B streptococcus, is present. Your caregiver will explain this further.  Your caregiver may ask you:   What your birth plan is.   How you are feeling.   If you are feeling the baby move.   If you have had any abnormal symptoms, such as leaking fluid, bleeding, severe headaches, or abdominal cramping.   If you have any questions.  Other tests or screenings that may be performed during your third trimester include:   Blood tests that check for low iron levels (anemia).   Fetal testing to check the health, activity level, and growth of the fetus. Testing is done if you have certain medical conditions or if there are problems during the pregnancy.  FALSE LABOR  You may feel small, irregular contractions that eventually go away. These are called Braxton Hicks contractions, or   false labor. Contractions may last for hours, days, or even weeks before true labor sets in. If contractions come at regular intervals, intensify, or become painful, it is best to be seen by your caregiver.   SIGNS OF LABOR    Menstrual-like cramps.   Contractions that are 5 minutes apart or less.   Contractions that start on the top of the uterus and spread down to the lower abdomen and back.   A sense of increased pelvic pressure or back pain.   A watery or bloody mucus discharge that comes from the vagina.  If you have any of these signs before the 37th week of pregnancy, call your caregiver right away. You need to go to the hospital to get checked immediately.  HOME CARE INSTRUCTIONS    Avoid all  smoking, herbs, alcohol, and unprescribed drugs. These chemicals affect the formation and growth of the baby.   Follow your caregiver's instructions regarding medicine use. There are medicines that are either safe or unsafe to take during pregnancy.   Exercise only as directed by your caregiver. Experiencing uterine cramps is a good sign to stop exercising.   Continue to eat regular, healthy meals.   Wear a good support bra for breast tenderness.   Do not use hot tubs, steam rooms, or saunas.   Wear your seat belt at all times when driving.   Avoid raw meat, uncooked cheese, cat litter boxes, and soil used by cats. These carry germs that can cause birth defects in the baby.   Take your prenatal vitamins.   Try taking a stool softener (if your caregiver approves) if you develop constipation. Eat more high-fiber foods, such as fresh vegetables or fruit and whole grains. Drink plenty of fluids to keep your urine clear or pale yellow.   Take warm sitz baths to soothe any pain or discomfort caused by hemorrhoids. Use hemorrhoid cream if your caregiver approves.   If you develop varicose veins, wear support hose. Elevate your feet for 15 minutes, 3 4 times a day. Limit salt in your diet.   Avoid heavy lifting, wear low heal shoes, and practice good posture.   Rest a lot with your legs elevated if you have leg cramps or low back pain.   Visit your dentist if you have not gone during your pregnancy. Use a soft toothbrush to brush your teeth and be gentle when you floss.   A sexual relationship may be continued unless your caregiver directs you otherwise.   Do not travel far distances unless it is absolutely necessary and only with the approval of your caregiver.   Take prenatal classes to understand, practice, and ask questions about the labor and delivery.   Make a trial run to the hospital.   Pack your hospital bag.   Prepare the baby's nursery.   Continue to go to all your prenatal visits as directed  by your caregiver.  SEEK MEDICAL CARE IF:   You are unsure if you are in labor or if your water has broken.   You have dizziness.   You have mild pelvic cramps, pelvic pressure, or nagging pain in your abdominal area.   You have persistent nausea, vomiting, or diarrhea.   You have a bad smelling vaginal discharge.   You have pain with urination.  SEEK IMMEDIATE MEDICAL CARE IF:    You have a fever.   You are leaking fluid from your vagina.   You have spotting or bleeding from your vagina.     You have severe abdominal cramping or pain.   You have rapid weight loss or gain.   You have shortness of breath with chest pain.   You notice sudden or extreme swelling of your face, hands, ankles, feet, or legs.   You have not felt your baby move in over an hour.   You have severe headaches that do not go away with medicine.   You have vision changes.  Document Released: 04/03/2001 Document Revised: 12/10/2012 Document Reviewed: 06/10/2012  [employer] Patient Information 2014 [employer].

## 2013-04-07 NOTE — Progress Notes (Signed)
No lof no vb, Ctx - 10x/day, Normal FM  No other complaints.   Kim Kirby is a 22 y.o. G1P0 at [redacted]w[redacted]d here for ROB visit.    Discussed with Patient:  - Plans to breast/bottle feed.  All questions answered. - Continue prenatal vitamins. - Reviewed fetal kick counts Pt to perform daily at a time when the baby is active, lie laterally with both hands on belly in quiet room and count all movements (hiccups, shoulder rolls, obvious kicks, etc); pt is to report to clinic MAU for less than 10 movements felt in a 2 hour time period-pt told as soon as she counts 10 movements the count is complete.  - Routine precautions discussed (depression, infection s/s).   Patient provided with all pertinent phone numbers for emergencies. - RTC for any VB, regular, painful cramps/ctxs occurring at a rate of >2/10 min, fever (100.5 or higher), n/v/d, any pain that is unresolving or worsening, LOF, decreased fetal movement, CP, SOB, edema -RTC in one week for next visit.  Problems: Patient Active Problem List   Diagnosis Date Noted  . Right serous otitis media 03/04/2013  . Abnormal glucose in pregnancy, antepartum 02/18/2013  . Insufficient prenatal care 12/12/2012    To Do: 1.   [ ]  Vaccines: [id number]  Tdap: recd'  [ ]  BCM: mirena [ ]  Readiness: baby has a place to sleep, car seat, other baby necessities.  Edu: [x ] PTL precautions; [ ]  BF class; [ ]  childbirth class; [ ]   BF counseling;  GBS neg

## 2013-04-14 ENCOUNTER — Encounter: Payer: Self-pay | Admitting: Family Medicine

## 2013-04-14 ENCOUNTER — Ambulatory Visit (INDEPENDENT_AMBULATORY_CARE_PROVIDER_SITE_OTHER): Payer: BC Managed Care – PPO | Admitting: Family Medicine

## 2013-04-14 VITALS — BP 131/86 | Temp 98.1°F | Wt 161.4 lb

## 2013-04-14 DIAGNOSIS — O0930 Supervision of pregnancy with insufficient antenatal care, unspecified trimester: Secondary | ICD-10-CM

## 2013-04-14 DIAGNOSIS — O0933 Supervision of pregnancy with insufficient antenatal care, third trimester: Secondary | ICD-10-CM

## 2013-04-14 LAB — POCT URINALYSIS DIP (DEVICE)
Bilirubin Urine: NEGATIVE
Glucose, UA: NEGATIVE mg/dL
Hgb urine dipstick: NEGATIVE
Ketones, ur: NEGATIVE mg/dL
Nitrite: NEGATIVE

## 2013-04-14 NOTE — Patient Instructions (Addendum)
Third Trimester of Pregnancy The third trimester is from week 29 through week 42, months 7 through 9. The third trimester is a time when the fetus is growing rapidly. At the end of the ninth month, the fetus is about 20 inches in length and weighs 6 10 pounds.  BODY CHANGES Your body goes through many changes during pregnancy. The changes vary from woman to woman.   Your weight will continue to increase. You can expect to gain 25 35 pounds (11 16 kg) by the end of the pregnancy.  You may begin to get stretch marks on your hips, abdomen, and breasts.  You may urinate more often because the fetus is moving lower into your pelvis and pressing on your bladder.  You may develop or continue to have heartburn as a result of your pregnancy.  You may develop constipation because certain hormones are causing the muscles that push waste through your intestines to slow down.  You may develop hemorrhoids or swollen, bulging veins (varicose veins).  You may have pelvic pain because of the weight gain and pregnancy hormones relaxing your joints between the bones in your pelvis. Back aches may result from over exertion of the muscles supporting your posture.  Your breasts will continue to grow and be tender. A yellow discharge may leak from your breasts called colostrum.  Your belly button may stick out.  You may feel short of breath because of your expanding uterus.  You may notice the fetus "dropping," or moving lower in your abdomen.  You may have a bloody mucus discharge. This usually occurs a few days to a week before labor begins.  Your cervix becomes thin and soft (effaced) near your due date. WHAT TO EXPECT AT YOUR PRENATAL EXAMS  You will have prenatal exams every 2 weeks until week 36. Then, you will have weekly prenatal exams. During a routine prenatal visit:  You will be weighed to make sure you and the fetus are growing normally.  Your blood pressure is taken.  Your abdomen will  be measured to track your baby's growth.  The fetal heartbeat will be listened to.  Any test results from the previous visit will be discussed.  You may have a cervical check near your due date to see if you have effaced. At around 36 weeks, your caregiver will check your cervix. At the same time, your caregiver will also perform a test on the secretions of the vaginal tissue. This test is to determine if a type of bacteria, Group B streptococcus, is present. Your caregiver will explain this further. Your caregiver may ask you:  What your birth plan is.  How you are feeling.  If you are feeling the baby move.  If you have had any abnormal symptoms, such as leaking fluid, bleeding, severe headaches, or abdominal cramping.  If you have any questions. Other tests or screenings that may be performed during your third trimester include:  Blood tests that check for low iron levels (anemia).  Fetal testing to check the health, activity level, and growth of the fetus. Testing is done if you have certain medical conditions or if there are problems during the pregnancy. FALSE LABOR You may feel small, irregular contractions that eventually go away. These are called Braxton Hicks contractions, or false labor. Contractions may last for hours, days, or even weeks before true labor sets in. If contractions come at regular intervals, intensify, or become painful, it is best to be seen by your caregiver.    SIGNS OF LABOR   Menstrual-like cramps.  Contractions that are 5 minutes apart or less.  Contractions that start on the top of the uterus and spread down to the lower abdomen and back.  A sense of increased pelvic pressure or back pain.  A watery or bloody mucus discharge that comes from the vagina. If you have any of these signs before the 37th week of pregnancy, call your caregiver right away. You need to go to the hospital to get checked immediately. HOME CARE INSTRUCTIONS   Avoid all  smoking, herbs, alcohol, and unprescribed drugs. These chemicals affect the formation and growth of the baby.  Follow your caregiver's instructions regarding medicine use. There are medicines that are either safe or unsafe to take during pregnancy.  Exercise only as directed by your caregiver. Experiencing uterine cramps is a good sign to stop exercising.  Continue to eat regular, healthy meals.  Wear a good support bra for breast tenderness.  Do not use hot tubs, steam rooms, or saunas.  Wear your seat belt at all times when driving.  Avoid raw meat, uncooked cheese, cat litter boxes, and soil used by cats. These carry germs that can cause birth defects in the baby.  Take your prenatal vitamins.  Try taking a stool softener (if your caregiver approves) if you develop constipation. Eat more high-fiber foods, such as fresh vegetables or fruit and whole grains. Drink plenty of fluids to keep your urine clear or pale yellow.  Take warm sitz baths to soothe any pain or discomfort caused by hemorrhoids. Use hemorrhoid cream if your caregiver approves.  If you develop varicose veins, wear support hose. Elevate your feet for 15 minutes, 3 4 times a day. Limit salt in your diet.  Avoid heavy lifting, wear low heal shoes, and practice good posture.  Rest a lot with your legs elevated if you have leg cramps or low back pain.  Visit your dentist if you have not gone during your pregnancy. Use a soft toothbrush to brush your teeth and be gentle when you floss.  A sexual relationship may be continued unless your caregiver directs you otherwise.  Do not travel far distances unless it is absolutely necessary and only with the approval of your caregiver.  Take prenatal classes to understand, practice, and ask questions about the labor and delivery.  Make a trial run to the hospital.  Pack your hospital bag.  Prepare the baby's nursery.  Continue to go to all your prenatal visits as directed  by your caregiver. SEEK MEDICAL CARE IF:  You are unsure if you are in labor or if your water has broken.  You have dizziness.  You have mild pelvic cramps, pelvic pressure, or nagging pain in your abdominal area.  You have persistent nausea, vomiting, or diarrhea.  You have a bad smelling vaginal discharge.  You have pain with urination. SEEK IMMEDIATE MEDICAL CARE IF:   You have a fever.  You are leaking fluid from your vagina.  You have spotting or bleeding from your vagina.  You have severe abdominal cramping or pain.  You have rapid weight loss or gain.  You have shortness of breath with chest pain.  You notice sudden or extreme swelling of your face, hands, ankles, feet, or legs.  You have not felt your baby move in over an hour.  You have severe headaches that do not go away with medicine.  You have vision changes. Document Released: 04/03/2001 Document Revised: 12/10/2012 Document Reviewed:   06/10/2012 [employer] Patient Information 2014 [employer].  Breastfeeding Deciding to breastfeed is one of the best choices you can make for you and your baby. A change in hormones during pregnancy causes your breast tissue to grow and increases the number and size of your milk ducts. These hormones also allow proteins, sugars, and fats from your blood supply to make breast milk in your milk-producing glands. Hormones prevent breast milk from being released before your baby is born as well as prompt milk flow after birth. Once breastfeeding has begun, thoughts of your baby, as well as his or her sucking or crying, can stimulate the release of milk from your milk-producing glands.  BENEFITS OF BREASTFEEDING For Your Baby  Your first milk (colostrum) helps your baby's digestive system function better.   There are antibodies in your milk that help your baby fight off infections.   Your baby has a lower incidence of asthma, allergies, and sudden infant death syndrome.    The nutrients in breast milk are better for your baby than infant formulas and are designed uniquely for your baby's needs.   Breast milk improves your baby's brain development.   Your baby is less likely to develop other conditions, such as childhood obesity, asthma, or type 2 diabetes mellitus.  For You   Breastfeeding helps to create a very special bond between you and your baby.   Breastfeeding is convenient. Breast milk is always available at the correct temperature and costs nothing.   Breastfeeding helps to burn calories and helps you lose the weight gained during pregnancy.   Breastfeeding makes your uterus contract to its prepregnancy size faster and slows bleeding (lochia) after you give birth.   Breastfeeding helps to lower your risk of developing type 2 diabetes mellitus, osteoporosis, and breast or ovarian cancer later in life. SIGNS THAT YOUR BABY IS HUNGRY Early Signs of Hunger  Increased alertness or activity.  Stretching.  Movement of the head from side to side.  Movement of the head and opening of the mouth when the corner of the mouth or cheek is stroked (rooting).  Increased sucking sounds, smacking lips, cooing, sighing, or squeaking.  Hand-to-mouth movements.  Increased sucking of fingers or hands. Late Signs of Hunger  Fussing.  Intermittent crying. Extreme Signs of Hunger Signs of extreme hunger will require calming and consoling before your baby will be able to breastfeed successfully. Do not wait for the following signs of extreme hunger to occur before you initiate breastfeeding:   Restlessness.  A loud, strong cry.   Screaming. BREASTFEEDING BASICS Breastfeeding Initiation  Find a comfortable place to sit or lie down, with your neck and back well supported.  Place a pillow or rolled up blanket under your baby to bring him or her to the level of your breast (if you are seated). Nursing pillows are specially designed to help  support your arms and your baby while you breastfeed.  Make sure that your baby's abdomen is facing your abdomen.   Gently massage your breast. With your fingertips, massage from your chest wall toward your nipple in a circular motion. This encourages milk flow. You may need to continue this action during the feeding if your milk flows slowly.  Support your breast with 4 fingers underneath and your thumb above your nipple. Make sure your fingers are well away from your nipple and your baby's mouth.   Stroke your baby's lips gently with your finger or nipple.   When your baby's mouth is   open wide enough, quickly bring your baby to your breast, placing your entire nipple and as much of the colored area around your nipple (areola) as possible into your baby's mouth.   More areola should be visible above your baby's upper lip than below the lower lip.   Your baby's tongue should be between his or her lower gum and your breast.   Ensure that your baby's mouth is correctly positioned around your nipple (latched). Your baby's lips should create a seal on your breast and be turned out (everted).  It is common for your baby to suck about 2 3 minutes in order to start the flow of breast milk. Latching Teaching your baby how to latch on to your breast properly is very important. An improper latch can cause nipple pain and decreased milk supply for you and poor weight gain in your baby. Also, if your baby is not latched onto your nipple properly, he or she may swallow some air during feeding. This can make your baby fussy. Burping your baby when you switch breasts during the feeding can help to get rid of the air. However, teaching your baby to latch on properly is still the best way to prevent fussiness from swallowing air while breastfeeding. Signs that your baby has successfully latched on to your nipple:    Silent tugging or silent sucking, without causing you pain.   Swallowing heard  between every 3 4 sucks.    Muscle movement above and in front of his or her ears while sucking.  Signs that your baby has not successfully latched on to nipple:   Sucking sounds or smacking sounds from your baby while breastfeeding.  Nipple pain. If you think your baby has not latched on correctly, slip your finger into the corner of your baby's mouth to break the suction and place it between your baby's gums. Attempt breastfeeding initiation again. Signs of Successful Breastfeeding Signs from your baby:   A gradual decrease in the number of sucks or complete cessation of sucking.   Falling asleep.   Relaxation of his or her body.   Retention of a small amount of milk in his or her mouth.   Letting go of your breast by himself or herself. Signs from you:  Breasts that have increased in firmness, weight, and size 1 3 hours after feeding.   Breasts that are softer immediately after breastfeeding.  Increased milk volume, as well as a change in milk consistency and color by the 5th day of breastfeeding.   Nipples that are not sore, cracked, or bleeding. Signs That Your Baby is Getting Enough Milk  Wetting at least 3 diapers in a 24-hour period. The urine should be clear and pale yellow by age 5 days.  At least 3 stools in a 24-hour period by age 5 days. The stool should be soft and yellow.  At least 3 stools in a 24-hour period by age 7 days. The stool should be seedy and yellow.  No loss of weight greater than 10% of birth weight during the first 3 days of age.  Average weight gain of 4 7 ounces (120 210 mL) per week after age 4 days.  Consistent daily weight gain by age 5 days, without weight loss after the age of 2 weeks. After a feeding, your baby may spit up a small amount. This is common. BREASTFEEDING FREQUENCY AND DURATION Frequent feeding will help you make more milk and can prevent sore nipples and breast engorgement.   Breastfeed when you feel the need to  reduce the fullness of your breasts or when your baby shows signs of hunger. This is called "breastfeeding on demand." Avoid introducing a pacifier to your baby while you are working to establish breastfeeding (the first 4 6 weeks after your baby is born). After this time you may choose to use a pacifier. Research has shown that pacifier use during the first year of a baby's life decreases the risk of sudden infant death syndrome (SIDS). Allow your baby to feed on each breast as long as he or she wants. Breastfeed until your baby is finished feeding. When your baby unlatches or falls asleep while feeding from the first breast, offer the second breast. Because newborns are often sleepy in the first few weeks of life, you may need to awaken your baby to get him or her to feed. Breastfeeding times will vary from baby to baby. However, the following rules can serve as a guide to help you ensure that your baby is properly fed:  Newborns (babies 4 weeks of age or younger) may breastfeed every 1 3 hours.  Newborns should not go longer than 3 hours during the day or 5 hours during the night without breastfeeding.  You should breastfeed your baby a minimum of 8 times in a 24-hour period until you begin to introduce solid foods to your baby at around 6 months of age. BREAST MILK PUMPING Pumping and storing breast milk allows you to ensure that your baby is exclusively fed your breast milk, even at times when you are unable to breastfeed. This is especially important if you are going back to work while you are still breastfeeding or when you are not able to be present during feedings. Your lactation consultant can give you guidelines on how long it is safe to store breast milk.  A breast pump is a machine that allows you to pump milk from your breast into a sterile bottle. The pumped breast milk can then be stored in a refrigerator or freezer. Some breast pumps are operated by hand, while others use electricity. Ask  your lactation consultant which type will work best for you. Breast pumps can be purchased, but some hospitals and breastfeeding support groups lease breast pumps on a monthly basis. A lactation consultant can teach you how to hand express breast milk, if you prefer not to use a pump.  CARING FOR YOUR BREASTS WHILE YOU BREASTFEED Nipples can become dry, cracked, and sore while breastfeeding. The following recommendations can help keep your breasts moisturized and healthy:  Avoid using soap on your nipples.   Wear a supportive bra. Although not required, special nursing bras and tank tops are designed to allow access to your breasts for breastfeeding without taking off your entire bra or top. Avoid wearing underwire style bras or extremely tight bras.  Air dry your nipples for 3 4minutes after each feeding.   Use only cotton bra pads to absorb leaked breast milk. Leaking of breast milk between feedings is normal.   Use lanolin on your nipples after breastfeeding. Lanolin helps to maintain your skin's normal moisture barrier. If you use pure lanolin you do not need to wash it off before feeding your baby again. Pure lanolin is not toxic to your baby. You may also hand express a few drops of breast milk and gently massage that milk into your nipples and allow the milk to air dry. In the first few weeks after giving birth, some women   experience extremely full breasts (engorgement). Engorgement can make your breasts feel heavy, warm, and tender to the touch. Engorgement peaks within 3 5 days after you give birth. The following recommendations can help ease engorgement:  Completely empty your breasts while breastfeeding or pumping. You may want to start by applying warm, moist heat (in the shower or with warm water-soaked hand towels) just before feeding or pumping. This increases circulation and helps the milk flow. If your baby does not completely empty your breasts while breastfeeding, pump any extra  milk after he or she is finished.  Wear a snug bra (nursing or regular) or tank top for 1 2 days to signal your body to slightly decrease milk production.  Apply ice packs to your breasts, unless this is too uncomfortable for you.  Make sure that your baby is latched on and positioned properly while breastfeeding. If engorgement persists after 48 hours of following these recommendations, contact your health care provider or a lactation consultant. OVERALL HEALTH CARE RECOMMENDATIONS WHILE BREASTFEEDING  Eat healthy foods. Alternate between meals and snacks, eating 3 of each per day. Because what you eat affects your breast milk, some of the foods may make your baby more irritable than usual. Avoid eating these foods if you are sure that they are negatively affecting your baby.  Drink milk, fruit juice, and water to satisfy your thirst (about 10 glasses a day).   Rest often, relax, and continue to take your prenatal vitamins to prevent fatigue, stress, and anemia.  Continue breast self-awareness checks.  Avoid chewing and smoking tobacco.  Avoid alcohol and drug use. Some medicines that may be harmful to your baby can pass through breast milk. It is important to ask your health care provider before taking any medicine, including all over-the-counter and prescription medicine as well as vitamin and herbal supplements. It is possible to become pregnant while breastfeeding. If birth control is desired, ask your health care provider about options that will be safe for your baby. SEEK MEDICAL CARE IF:   You feel like you want to stop breastfeeding or have become frustrated with breastfeeding.  You have painful breasts or nipples.  Your nipples are cracked or bleeding.  Your breasts are red, tender, or warm.  You have a swollen area on either breast.  You have a fever or chills.  You have nausea or vomiting.  You have drainage other than breast milk from your nipples.  Your breasts  do not become full before feedings by the 5th day after you give birth.  You feel sad and depressed.  Your baby is too sleepy to eat well.  Your baby is having trouble sleeping.   Your baby is wetting less than 3 diapers in a 24-hour period.  Your baby has less than 3 stools in a 24-hour period.  Your baby's skin or the white part of his or her eyes becomes yellow.   Your baby is not gaining weight by 5 days of age. SEEK IMMEDIATE MEDICAL CARE IF:   Your baby is overly tired (lethargic) and does not want to wake up and feed.  Your baby develops an unexplained fever. Document Released: 04/09/2005 Document Revised: 12/10/2012 Document Reviewed: 10/01/2012 [employer] Patient Information 2014 [employer].    Vaginal Delivery During delivery, your health care provider will help you give birth to your baby. During a vaginal delivery, you will work to push the baby out of your vagina. However, before you can push your baby out, a few   things need to happen. The opening of your uterus (cervix) has to soften, thin out, and open up (dilate) all the way to 10 cm. Also, your baby has to move down from the uterus into your vagina.  SIGNS OF LABOR  Your health care provider will first need to make sure you are in labor. Signs of labor include:   Passing what is called the mucous plug before labor begins. This is a small amount of blood-stained mucus.   Having regular, painful uterine contractions.   The time between contractions gets shorter.   The discomfort and pain gradually get more intense.  Contraction pains get worse when walking and do not go away when resting.   Your cervix becomes thinner (effacement) and dilates. BEFORE THE DELIVERY Once you are in labor and admitted into the hospital or care center, your health care provider may do the following:   Perform a complete physical exam.  Review any complications related to pregnancy or labor.  Check your blood  pressure, pulse, temperature, and heart rate (vital signs).   Determine if, and when, the rupture of amniotic membranes occurred.  Do a vaginal exam (using a sterile glove and lubricant) to determine:   The position (presentation) of the baby. Is the baby's head presenting first (vertex) in the birth canal (vagina), or are the feet or buttocks first (breech)?   The level (station) of the baby's head within the birth canal.   The effacement and dilatation of the cervix.   An electronic fetal monitor is usually placed on your abdomen when you first arrive. This is used to monitor your contractions and the baby's heart rate.  When the monitor is on your abdomen (external fetal monitor), it can only pick up the frequency and length of your contractions. It cannot tell the strength of your contractions.  If it becomes necessary for your health care provider to know exactly how strong your contractions are or to see exactly what the baby's heart rate is doing, an internal monitor may be inserted into your vagina and uterus. Your health care provider will discuss the benefits and risks of using an internal monitor and obtain your permission before inserting the device.  Continuous fetal monitoring may be needed if you have an epidural, are receiving certain medicines (such as oxytocin), or have pregnancy or labor complications.  An IV access tube may be placed into a vein in your arm to deliver fluids and medicines if necessary. THREE STAGES OF LABOR AND DELIVERY Normal labor and delivery is divided into three stages. First Stage This stage starts when you begin to contract regularly and your cervix begins to efface and dilate. It ends when your cervix is completely open (fully dilated). The first stage is the longest stage of labor and can last from 3 hours to 15 hours.  Several methods are available to help with labor pain. You and your health care provider will decide which option is best  for you. Options include:   Opioid medicines. These are strong pain medicines that you can get through your IV tube or as a shot into your muscle. These medicines lessen pain but do not make it go away completely.  Epidural. A medicine is given through a thin tube that is inserted in your back. The medicine numbs the lower part of your body and prevents any pain in that area.  Paracervical pain medicine. This is an injection of an anesthetic on each side of your cervix.     You may request natural childbirth, which does not involve the use of pain medicines or an epidural during labor and delivery. Instead, you will use other things, such as breathing exercises, to help cope with the pain. Second Stage The second stage of labor begins when your cervix is fully dilated at 10 cm. It continues until you push your baby down through the birth canal and the baby is born. This stage can take only minutes or several hours.  The location of your baby's head as it moves through the birth canal is reported as a number called a station. If the baby's head has not started its descent, the station is described as being at minus 3 ( 3). When your baby's head is at the zero station, it is at the middle of the birth canal and is engaged in the pelvis. The station of your baby helps indicate the progress of the second stage of labor.  When your baby is born, your health care provider may hold the baby with his or her head lowered to prevent amniotic fluid, mucus, and blood from getting into the baby's lungs. The baby's mouth and nose may be suctioned with a small bulb syringe to remove any additional fluid.  Your health care provider may then place the baby on your stomach. It is important to keep the baby from getting cold. To do this, the health care provider will dry the baby off, place the baby directly on your skin (with no blankets between you and the baby), and cover the baby with warm, dry blankets.   The  umbilical cord is cut. Third Stage During the third stage of labor, your health care provider will deliver the placenta (afterbirth) and make sure your bleeding is under control. The delivery of the placenta usually takes about 5 minutes but can take up to 30 minutes. After the placenta is delivered, a medicine may be given either by IV or injection to help contract the uterus and control bleeding. If you are planning to breastfeed, you can try to do so now. After you deliver the placenta, your uterus should contract and get very firm. If your uterus does not remain firm, your health care provider will massage it. This is important because the contraction of the uterus helps cut off bleeding at the site where the placenta was attached to your uterus. If your uterus does not contract properly and stay firm, you may continue to bleed heavily. If there is a lot of bleeding, medicines may be given to contract the uterus and stop the bleeding.  Document Released: 01/17/2008 Document Revised: 12/10/2012 Document Reviewed: 09/28/2012 [employer] Patient Information 2014 [employer].  

## 2013-04-14 NOTE — Progress Notes (Signed)
Labor precautions

## 2013-04-14 NOTE — Progress Notes (Signed)
P-100 

## 2013-04-21 ENCOUNTER — Encounter: Payer: Self-pay | Admitting: Family Medicine

## 2013-04-21 ENCOUNTER — Ambulatory Visit (INDEPENDENT_AMBULATORY_CARE_PROVIDER_SITE_OTHER): Payer: BC Managed Care – PPO | Admitting: Family Medicine

## 2013-04-21 VITALS — BP 114/79 | Temp 98.0°F | Wt 162.4 lb

## 2013-04-21 DIAGNOSIS — Z3403 Encounter for supervision of normal first pregnancy, third trimester: Secondary | ICD-10-CM

## 2013-04-21 DIAGNOSIS — O0930 Supervision of pregnancy with insufficient antenatal care, unspecified trimester: Secondary | ICD-10-CM

## 2013-04-21 LAB — POCT URINALYSIS DIP (DEVICE)
Bilirubin Urine: NEGATIVE
Ketones, ur: NEGATIVE mg/dL
Leukocytes, UA: NEGATIVE
Specific Gravity, Urine: 1.02 (ref 1.005–1.030)
pH: 6.5 (ref 5.0–8.0)

## 2013-04-21 NOTE — Patient Instructions (Signed)
Third Trimester of Pregnancy  The third trimester is from week 29 through week 42, months 7 through 9. The third trimester is a time when the fetus is growing rapidly. At the end of the ninth month, the fetus is about 20 inches in length and weighs 6 10 pounds.   BODY CHANGES  Your body goes through many changes during pregnancy. The changes vary from woman to woman.    Your weight will continue to increase. You can expect to gain 25 35 pounds (11 16 kg) by the end of the pregnancy.   You may begin to get stretch marks on your hips, abdomen, and breasts.   You may urinate more often because the fetus is moving lower into your pelvis and pressing on your bladder.   You may develop or continue to have heartburn as a result of your pregnancy.   You may develop constipation because certain hormones are causing the muscles that push waste through your intestines to slow down.   You may develop hemorrhoids or swollen, bulging veins (varicose veins).   You may have pelvic pain because of the weight gain and pregnancy hormones relaxing your joints between the bones in your pelvis. Back aches may result from over exertion of the muscles supporting your posture.   Your breasts will continue to grow and be tender. A yellow discharge may leak from your breasts called colostrum.   Your belly button may stick out.   You may feel short of breath because of your expanding uterus.   You may notice the fetus "dropping," or moving lower in your abdomen.   You may have a bloody mucus discharge. This usually occurs a few days to a week before labor begins.   Your cervix becomes thin and soft (effaced) near your due date.  WHAT TO EXPECT AT YOUR PRENATAL EXAMS   You will have prenatal exams every 2 weeks until week 36. Then, you will have weekly prenatal exams. During a routine prenatal visit:   You will be weighed to make sure you and the fetus are growing normally.   Your blood pressure is taken.   Your abdomen will be  measured to track your baby's growth.   The fetal heartbeat will be listened to.   Any test results from the previous visit will be discussed.   You may have a cervical check near your due date to see if you have effaced.  At around 36 weeks, your caregiver will check your cervix. At the same time, your caregiver will also perform a test on the secretions of the vaginal tissue. This test is to determine if a type of bacteria, Group B streptococcus, is present. Your caregiver will explain this further.  Your caregiver may ask you:   What your birth plan is.   How you are feeling.   If you are feeling the baby move.   If you have had any abnormal symptoms, such as leaking fluid, bleeding, severe headaches, or abdominal cramping.   If you have any questions.  Other tests or screenings that may be performed during your third trimester include:   Blood tests that check for low iron levels (anemia).   Fetal testing to check the health, activity level, and growth of the fetus. Testing is done if you have certain medical conditions or if there are problems during the pregnancy.  FALSE LABOR  You may feel small, irregular contractions that eventually go away. These are called Braxton Hicks contractions, or   false labor. Contractions may last for hours, days, or even weeks before true labor sets in. If contractions come at regular intervals, intensify, or become painful, it is best to be seen by your caregiver.   SIGNS OF LABOR    Menstrual-like cramps.   Contractions that are 5 minutes apart or less.   Contractions that start on the top of the uterus and spread down to the lower abdomen and back.   A sense of increased pelvic pressure or back pain.   A watery or bloody mucus discharge that comes from the vagina.  If you have any of these signs before the 37th week of pregnancy, call your caregiver right away. You need to go to the hospital to get checked immediately.  HOME CARE INSTRUCTIONS    Avoid all  smoking, herbs, alcohol, and unprescribed drugs. These chemicals affect the formation and growth of the baby.   Follow your caregiver's instructions regarding medicine use. There are medicines that are either safe or unsafe to take during pregnancy.   Exercise only as directed by your caregiver. Experiencing uterine cramps is a good sign to stop exercising.   Continue to eat regular, healthy meals.   Wear a good support bra for breast tenderness.   Do not use hot tubs, steam rooms, or saunas.   Wear your seat belt at all times when driving.   Avoid raw meat, uncooked cheese, cat litter boxes, and soil used by cats. These carry germs that can cause birth defects in the baby.   Take your prenatal vitamins.   Try taking a stool softener (if your caregiver approves) if you develop constipation. Eat more high-fiber foods, such as fresh vegetables or fruit and whole grains. Drink plenty of fluids to keep your urine clear or pale yellow.   Take warm sitz baths to soothe any pain or discomfort caused by hemorrhoids. Use hemorrhoid cream if your caregiver approves.   If you develop varicose veins, wear support hose. Elevate your feet for 15 minutes, 3 4 times a day. Limit salt in your diet.   Avoid heavy lifting, wear low heal shoes, and practice good posture.   Rest a lot with your legs elevated if you have leg cramps or low back pain.   Visit your dentist if you have not gone during your pregnancy. Use a soft toothbrush to brush your teeth and be gentle when you floss.   A sexual relationship may be continued unless your caregiver directs you otherwise.   Do not travel far distances unless it is absolutely necessary and only with the approval of your caregiver.   Take prenatal classes to understand, practice, and ask questions about the labor and delivery.   Make a trial run to the hospital.   Pack your hospital bag.   Prepare the baby's nursery.   Continue to go to all your prenatal visits as directed  by your caregiver.  SEEK MEDICAL CARE IF:   You are unsure if you are in labor or if your water has broken.   You have dizziness.   You have mild pelvic cramps, pelvic pressure, or nagging pain in your abdominal area.   You have persistent nausea, vomiting, or diarrhea.   You have a bad smelling vaginal discharge.   You have pain with urination.  SEEK IMMEDIATE MEDICAL CARE IF:    You have a fever.   You are leaking fluid from your vagina.   You have spotting or bleeding from your vagina.     You have severe abdominal cramping or pain.   You have rapid weight loss or gain.   You have shortness of breath with chest pain.   You notice sudden or extreme swelling of your face, hands, ankles, feet, or legs.   You have not felt your baby move in over an hour.   You have severe headaches that do not go away with medicine.   You have vision changes.  Document Released: 04/03/2001 Document Revised: 12/10/2012 Document Reviewed: 06/10/2012  [employer] Patient Information 2014 [employer].

## 2013-04-21 NOTE — Progress Notes (Signed)
+  FM no LOF, NO VB, no ctx  Desires to be checked today.  Kim Kirby is a 22 y.o. G1P0 at 30w1dhere for ROB visit.    Discussed with Patient:  - Plans to breast feed.  All questions answered. - Continue prenatal vitamins. - Reviewed fetal kick counts Pt to perform daily at a time when the baby is active, lie laterally with both hands on belly in quiet room and count all movements (hiccups, shoulder rolls, obvious kicks, etc); pt is to report to clinic MAU for less than 10 movements felt in a 2 hour time period-pt told as soon as she counts 10 movements the count is complete.  - Routine precautions discussed (depression, infection s/s).   Patient provided with all pertinent phone numbers for emergencies. - RTC for any VB, regular, painful cramps/ctxs occurring at a rate of >2/10 min, fever (100.5 or higher), n/v/d, any pain that is unresolving or worsening, LOF, decreased fetal movement, CP, SOB, edema -RTC in one week for next visit.  Problems: Patient Active Problem List   Diagnosis Date Noted  . Abnormal glucose in pregnancy, antepartum 02/18/2013  . Insufficient prenatal care 12/12/2012    To Do: 1.   [ ]  Vaccines: Rec'd [ ]  BCM: mirena [ ]  Readiness: baby has a place to sleep, car seat, other baby necessities.  Edu: [ ]  PTL precautions; [ ]  BF class; [ ]  childbirth class; [ ]   BF counseling;

## 2013-04-21 NOTE — Progress Notes (Signed)
P=119  Pt desires cervical exam today.

## 2013-04-23 ENCOUNTER — Encounter (HOSPITAL_COMMUNITY): Payer: Self-pay | Admitting: *Deleted

## 2013-04-23 ENCOUNTER — Encounter (HOSPITAL_COMMUNITY): Payer: Self-pay | Admitting: Anesthesiology

## 2013-04-23 ENCOUNTER — Inpatient Hospital Stay (HOSPITAL_COMMUNITY)
Admission: AD | Admit: 2013-04-23 | Discharge: 2013-04-25 | DRG: 775 | Disposition: A | Discharge status: Home / Self Care | Payer: Medicaid Other | Source: Ambulatory Visit | Attending: Obstetrics & Gynecology | Admitting: Obstetrics & Gynecology

## 2013-04-23 DIAGNOSIS — IMO0001 Reserved for inherently not codable concepts without codable children: Secondary | ICD-10-CM

## 2013-04-23 DIAGNOSIS — Z87891 Personal history of nicotine dependence: Secondary | ICD-10-CM

## 2013-04-23 DIAGNOSIS — O0933 Supervision of pregnancy with insufficient antenatal care, third trimester: Secondary | ICD-10-CM

## 2013-04-23 LAB — CBC
HCT: 36.2 % (ref 36.0–46.0)
Hemoglobin: 12 g/dL (ref 12.0–15.0)
MCH: 26 pg (ref 26.0–34.0)
MCHC: 33.1 g/dL (ref 30.0–36.0)
MCV: 78.4 fL (ref 78.0–100.0)
Platelets: 213 10*3/uL (ref 150–400)
RBC: 4.62 MIL/uL (ref 3.87–5.11)
RDW: 14.5 % (ref 11.5–15.5)
WBC: 8.8 10*3/uL (ref 4.0–10.5)

## 2013-04-23 LAB — RPR: RPR Ser Ql: NONREACTIVE

## 2013-04-23 MED ORDER — LIDOCAINE HCL (PF) 1 % IJ SOLN
30.0000 mL | INTRAMUSCULAR | Status: DC | PRN
  Administered 2013-04-23: 30 mL via SUBCUTANEOUS
  Filled 2013-04-23 (×2): qty 30

## 2013-04-23 MED ORDER — ONDANSETRON HCL 4 MG/2ML IJ SOLN: 4.0000 mg | Freq: Four times a day (QID) | INTRAMUSCULAR | Status: DC | PRN

## 2013-04-23 MED ORDER — LACTATED RINGERS IV SOLN
INTRAVENOUS | Status: DC
  Administered 2013-04-23: 14:00:00 via INTRAVENOUS

## 2013-04-23 MED ORDER — EPHEDRINE 5 MG/ML INJ: 10.0000 mg | INTRAVENOUS | Status: DC | PRN

## 2013-04-23 MED ORDER — TETANUS-DIPHTH-ACELL PERTUSSIS 5-2.5-18.5 LF-MCG/0.5 IM SUSP: 0.5000 mL | Freq: Once | INTRAMUSCULAR | Status: DC

## 2013-04-23 MED ORDER — IBUPROFEN 600 MG PO TABS
600.0000 mg | ORAL_TABLET | Freq: Four times a day (QID) | ORAL | Status: DC
  Administered 2013-04-24 – 2013-04-25 (×6): 600 mg via ORAL
  Filled 2013-04-23 (×6): qty 1

## 2013-04-23 MED ORDER — SIMETHICONE 80 MG PO CHEW: 80.0000 mg | CHEWABLE_TABLET | ORAL | Status: DC | PRN

## 2013-04-23 MED ORDER — OXYTOCIN 40 UNITS IN LACTATED RINGERS INFUSION - SIMPLE MED
62.5000 mL/h | INTRAVENOUS | Status: DC
  Administered 2013-04-23: 62.5 mL/h via INTRAVENOUS

## 2013-04-23 MED ORDER — FENTANYL CITRATE 0.05 MG/ML IJ SOLN
100.0000 ug | INTRAMUSCULAR | Status: DC | PRN
  Administered 2013-04-23: 100 ug via INTRAVENOUS
  Filled 2013-04-23: qty 2

## 2013-04-23 MED ORDER — DIBUCAINE 1 % RE OINT: 1.0000 "application " | TOPICAL_OINTMENT | RECTAL | Status: DC | PRN

## 2013-04-23 MED ORDER — IBUPROFEN 600 MG PO TABS
600.0000 mg | ORAL_TABLET | Freq: Four times a day (QID) | ORAL | Status: DC | PRN
  Administered 2013-04-23: 600 mg via ORAL
  Filled 2013-04-23: qty 1

## 2013-04-23 MED ORDER — LACTATED RINGERS IV SOLN: 500.0000 mL | INTRAVENOUS | Status: DC | PRN

## 2013-04-23 MED ORDER — DIPHENHYDRAMINE HCL 50 MG/ML IJ SOLN: 12.5000 mg | INTRAMUSCULAR | Status: DC | PRN

## 2013-04-23 MED ORDER — FENTANYL 2.5 MCG/ML BUPIVACAINE 1/10 % EPIDURAL INFUSION (WH - ANES): 14.0000 mL/h | INTRAMUSCULAR | Status: DC | PRN

## 2013-04-23 MED ORDER — CITRIC ACID-SODIUM CITRATE 334-500 MG/5ML PO SOLN: 30.0000 mL | ORAL | Status: DC | PRN

## 2013-04-23 MED ORDER — NALOXONE HCL 0.4 MG/ML IJ SOLN
INTRAMUSCULAR | Status: AC
  Filled 2013-04-23: qty 1

## 2013-04-23 MED ORDER — SENNOSIDES-DOCUSATE SODIUM 8.6-50 MG PO TABS
2.0000 | ORAL_TABLET | ORAL | Status: DC
  Administered 2013-04-24 – 2013-04-25 (×2): 2 via ORAL
  Filled 2013-04-23 (×2): qty 2

## 2013-04-23 MED ORDER — WITCH HAZEL-GLYCERIN EX PADS: 1.0000 "application " | MEDICATED_PAD | CUTANEOUS | Status: DC | PRN

## 2013-04-23 MED ORDER — LACTATED RINGERS IV SOLN: 500.0000 mL | Freq: Once | INTRAVENOUS | Status: DC

## 2013-04-23 MED ORDER — LANOLIN HYDROUS EX OINT: TOPICAL_OINTMENT | CUTANEOUS | Status: DC | PRN

## 2013-04-23 MED ORDER — EPHEDRINE 5 MG/ML INJ
10.0000 mg | INTRAVENOUS | Status: DC | PRN
  Filled 2013-04-23: qty 2

## 2013-04-23 MED ORDER — IBUPROFEN 600 MG PO TABS: 600.0000 mg | ORAL_TABLET | Freq: Four times a day (QID) | ORAL | Status: DC | PRN

## 2013-04-23 MED ORDER — PHENYLEPHRINE 40 MCG/ML (10ML) SYRINGE FOR IV PUSH (FOR BLOOD PRESSURE SUPPORT)
80.0000 ug | PREFILLED_SYRINGE | INTRAVENOUS | Status: DC | PRN
  Filled 2013-04-23: qty 2

## 2013-04-23 MED ORDER — OXYTOCIN BOLUS FROM INFUSION: 500.0000 mL | INTRAVENOUS | Status: DC

## 2013-04-23 MED ORDER — PHENYLEPHRINE 40 MCG/ML (10ML) SYRINGE FOR IV PUSH (FOR BLOOD PRESSURE SUPPORT): 80.0000 ug | PREFILLED_SYRINGE | INTRAVENOUS | Status: DC | PRN

## 2013-04-23 MED ORDER — LIDOCAINE HCL (PF) 1 % IJ SOLN
30.0000 mL | INTRAMUSCULAR | Status: DC | PRN
  Filled 2013-04-23: qty 30

## 2013-04-23 MED ORDER — OXYCODONE-ACETAMINOPHEN 5-325 MG PO TABS
1.0000 | ORAL_TABLET | ORAL | Status: DC | PRN
  Administered 2013-04-23: 1 via ORAL
  Filled 2013-04-23: qty 1
  Filled 2013-04-23: qty 2

## 2013-04-23 MED ORDER — OXYCODONE-ACETAMINOPHEN 5-325 MG PO TABS: 1.0000 | ORAL_TABLET | ORAL | Status: DC | PRN

## 2013-04-23 MED ORDER — DIPHENHYDRAMINE HCL 25 MG PO CAPS: 25.0000 mg | ORAL_CAPSULE | Freq: Four times a day (QID) | ORAL | Status: DC | PRN

## 2013-04-23 MED ORDER — ACETAMINOPHEN 325 MG PO TABS: 650.0000 mg | ORAL_TABLET | ORAL | Status: DC | PRN

## 2013-04-23 MED ORDER — BUTORPHANOL TARTRATE 1 MG/ML IJ SOLN
1.0000 mg | INTRAMUSCULAR | Status: DC | PRN
  Administered 2013-04-23: 1 mg via INTRAVENOUS
  Filled 2013-04-23 (×2): qty 1

## 2013-04-23 MED ORDER — MISOPROSTOL 200 MCG PO TABS
ORAL_TABLET | ORAL | Status: AC
  Administered 2013-04-23: 800 ug
  Filled 2013-04-23: qty 4

## 2013-04-23 MED ORDER — PRENATAL MULTIVITAMIN CH
1.0000 | ORAL_TABLET | Freq: Every day | ORAL | Status: DC
  Administered 2013-04-24: 1 via ORAL
  Filled 2013-04-23: qty 1

## 2013-04-23 MED ORDER — ZOLPIDEM TARTRATE 5 MG PO TABS: 5.0000 mg | ORAL_TABLET | Freq: Every evening | ORAL | Status: DC | PRN

## 2013-04-23 MED ORDER — ONDANSETRON HCL 4 MG PO TABS: 4.0000 mg | ORAL_TABLET | ORAL | Status: DC | PRN

## 2013-04-23 MED ORDER — OXYTOCIN 40 UNITS IN LACTATED RINGERS INFUSION - SIMPLE MED
62.5000 mL/h | INTRAVENOUS | Status: DC
  Filled 2013-04-23: qty 1000

## 2013-04-23 MED ORDER — BENZOCAINE-MENTHOL 20-0.5 % EX AERO
1.0000 "application " | INHALATION_SPRAY | CUTANEOUS | Status: DC | PRN
  Administered 2013-04-23: 1 via TOPICAL
  Filled 2013-04-23: qty 56

## 2013-04-23 MED ORDER — ONDANSETRON HCL 4 MG/2ML IJ SOLN: 4.0000 mg | INTRAMUSCULAR | Status: DC | PRN

## 2013-04-23 MED ORDER — LACTATED RINGERS IV SOLN
INTRAVENOUS | Status: DC
  Administered 2013-04-23: 09:00:00 via INTRAVENOUS

## 2013-04-23 NOTE — MAU Note (Signed)
Patient states she is having contractions every 3-5 minutes. Denies bleeding or leaking and reports good fetal movement.  

## 2013-04-23 NOTE — H&P (Signed)
Kim Kirby is a 23 y.o. female G1P0 presenting for labor evaluation.  She is pt of [facility], with uncomplicated pregnancy course.  She reports good fetal movement, denies LOF, vaginal bleeding, vaginal itching/burning, urinary symptoms, h/a, dizziness, n/v, or fever/chills.    Maternal Medical History:  Reason for admission: Contractions.  Nausea.  Contractions: Onset was 3-5 hours ago.    Fetal activity: Perceived fetal activity is normal.   Last perceived fetal movement was within the past hour.    Prenatal complications: no prenatal complications Prenatal Complications - Diabetes: none.    OB History   Grav Para Term Preterm Abortions TAB SAB Ect Mult Living   1         0     Past Medical History  Diagnosis Date  . Asthma   . Seasonal allergic rhinitis     spring  . Wears glasses    History reviewed. No pertinent past surgical history. Family History: family history includes Cancer in her maternal aunt; Hypertension in her mother; Other in her father. There is no history of Heart disease, Diabetes, or Stroke. Social History:  reports that she has quit smoking. She has never used smokeless tobacco. She reports that she drinks alcohol. She reports that she does not use illicit drugs.   Prenatal Transfer Tool  Maternal Diabetes: No Genetic Screening: Normal Maternal Ultrasounds/Referrals: Normal Fetal Ultrasounds or other Referrals:  None Maternal Substance Abuse:  No Significant Maternal Medications:  None Significant Maternal Lab Results:  Lab values include: Group B Strep negative Other Comments:  None  Review of Systems  Constitutional: Negative for fever, chills and malaise/fatigue.  Eyes: Negative for blurred vision.  Respiratory: Negative for cough and shortness of breath.   Cardiovascular: Negative for chest pain.  Gastrointestinal: Positive for abdominal pain. Negative for heartburn, nausea and vomiting.  Genitourinary: Negative for dysuria, urgency and  frequency.  Musculoskeletal: Negative.   Neurological: Negative for dizziness and headaches.  Psychiatric/Behavioral: Negative for depression.    Dilation: 7.5 Effacement (%): 100 Station: -2 Exam by:: Kim Kirby,cnm Blood pressure 124/78, pulse 95, temperature 98.7 F (37.1 C), temperature source Oral, resp. rate 20, height 5\' 1"  (1.549 m), weight 74.571 kg (164 lb 6.4 oz), last menstrual period 06/20/2012. Maternal Exam:  Uterine Assessment: Contraction strength is moderate.  Contraction duration is 70 seconds. Contraction frequency is regular.   Abdomen: Fetal presentation: vertex  Cervix: Cervix evaluated by digital exam.     Fetal Exam Fetal Monitor Review: Mode: ultrasound.   Baseline rate: 135.  Variability: moderate (6-25 bpm).   Pattern: accelerations present and no decelerations.       Physical Exam  Nursing note and vitals reviewed. Constitutional: She is oriented to person, place, and time. She appears well-developed and well-nourished.  Neck: Normal range of motion.  Cardiovascular: Normal rate and regular rhythm.   Respiratory: Effort normal and breath sounds normal.  GI: Soft. Bowel sounds are normal.  Musculoskeletal: Normal range of motion.  Neurological: She is alert and oriented to person, place, and time. She has normal reflexes.  Skin: Skin is warm and dry.  Psychiatric: She has a normal mood and affect. Her behavior is normal. Judgment and thought content normal.    Prenatal labs: ABO, Rh: A/Positive/-- (06/23 0000) Antibody: Negative (06/23 0000) Rubella: Immune (06/23 0000) RPR: NON REAC (10/15 1113)  HBsAg: Negative (06/23 0000)  HIV: NON REACTIVE (10/15 1113)  GBS: Negative (12/10 0000)  1 hour glucose 153, Normal 3 hour test  Assessment/Plan:  Active labor at term GBS negative  [facility] Anticipate NSVD   Kim Kirby 04/23/2013, 12:45 PM

## 2013-04-23 NOTE — Progress Notes (Signed)
Kim Kirby is a 23 y.o. G1P0 at [redacted]w[redacted]d admitted for active labor  Subjective: Pt breathing with contractions, her mother is rubbing her back and providing labor support.  Objective: BP 110/63  Pulse 98  Temp(Src) 98.7 F (37.1 C) (Oral)  Resp 22  Ht 5\' 1"  (1.549 m)  Wt 74.571 kg (164 lb 6.4 oz)  BMI 31.08 kg/m2  LMP 06/20/2012      FHT:  FHR: 135 bpm, variability: moderate,  accelerations:  Abscent,  decelerations:  Absent UC:   regular, every 3 minutes SVE:   Dilation: 7.5 Effacement (%): 100 Station: -2 Exam by:: [email or web address]  Labs: Lab Results  Component Value Date   WBC 8.8 04/23/2013   HGB 12.0 04/23/2013   HCT 36.2 04/23/2013   MCV 78.4 04/23/2013   PLT 213 04/23/2013    Assessment / Plan: Spontaneous labor, progressing normally  Labor: Progressing normally.  Offered AROM to pt, pt prefers to wait for SROM for now.  Pt requesting more IV pain medication.  Does not desire epidural. IV Fentanyl ordered. Preeclampsia:  n/a Fetal Wellbeing:  Category I Pain Control:  stadol currently, >1 hour ago.  Fentanyl ordered. I/D:  n/a Anticipated MOD:  NSVD  Kirby, Kim Abood 04/23/2013, 2:21 PM

## 2013-04-23 NOTE — Progress Notes (Signed)
Pt up to br at 1754, pt c/o feeling dizzy, extra RN's called to Bathroom, pt layed her head on RN's shoulder for 30 seconds before pt raised head and became verbal again. Pt then stood up and assisted to wheelchair.

## 2013-04-23 NOTE — L&D Delivery Note (Signed)
22 y.o. G1P1001 @[redacted]w[redacted]d  [facility], labored with IV pain medications only, and progressed at a normal pace to 10 cm.  Pt declined AROM during labor and SROM during pushing with clear fluid.    Delivery Note At 3:54 PM a viable and healthy female was delivered via Vaginal, Spontaneous Delivery (Presentation: Right Occiput Anterior).  APGAR: 9, 9; weight pending .   Placenta status: Intact, Spontaneous.  Cord: 3 vessels with the following complications: None.    Anesthesia: Local  Episiotomy: None Lacerations: 2nd degree perineal Suture Repair: 3.0 vicryl rapide Est. Blood Loss (mL): 600.  Cytotec 800 mcg given rectally at delivery along with routine IV Pitocin bolus.  Uterus boggy initially, then firm and bleeding slowed to trickle only after medications and massage.  Mom to postpartum.  Baby to Couplet care / Skin to Skin.  [name], Charolette Bultman 04/23/2013, 4:31 PM

## 2013-04-23 NOTE — Anesthesia Preprocedure Evaluation (Deleted)
Anesthesia Evaluation  Patient identified by MRN, date of birth, ID band Patient awake    Reviewed: Allergy & Precautions, H&P , Patient's Chart, lab work & pertinent test results  Airway Mallampati: II TM Distance: >3 FB Neck ROM: full    Dental   Pulmonary asthma , former smoker,  breath sounds clear to auscultation        Cardiovascular Rhythm:regular Rate:Normal     Neuro/Psych    GI/Hepatic   Endo/Other    Renal/GU      Musculoskeletal   Abdominal   Peds  Hematology   Anesthesia Other Findings   Reproductive/Obstetrics (+) Pregnancy                           Anesthesia Physical Anesthesia Plan  ASA: II  Anesthesia Plan: Epidural   Post-op Pain Management:    Induction:   Airway Management Planned:   Additional Equipment:   Intra-op Plan:   Post-operative Plan:   Informed Consent: I have reviewed the patients History and Physical, chart, labs and discussed the procedure including the risks, benefits and alternatives for the proposed anesthesia with the patient or authorized representative who has indicated his/her understanding and acceptance.     Plan Discussed with:   Anesthesia Plan Comments:         Anesthesia Quick Evaluation  

## 2013-04-23 NOTE — H&P (Signed)
Attestation of Attending Supervision of Advanced Practitioner (PA/CNM/NP): Evaluation and management procedures were performed by the Advanced Practitioner under my supervision and collaboration.  I have reviewed the Advanced Practitioner's note and chart, and I agree with the management and plan.  Kensley Lares, MD, FACOG Attending Obstetrician & Gynecologist Faculty Practice, [facility]  

## 2013-04-23 NOTE — L&D Delivery Note (Signed)
Attestation of Attending Supervision of Advanced Practitioner (CNM/NP): Evaluation and management procedures were performed by the Advanced Practitioner under my supervision and collaboration. I have reviewed the Advanced Practitioner's note and chart, and I agree with the management and plan.  Zion Ta H. 4:20 PM

## 2013-04-23 NOTE — Progress Notes (Signed)
Pt tried to get up to shower w/RN and mothr. She sat back down in bed and said "I can't do it., it's too hard to get up and move."

## 2013-04-23 NOTE — Progress Notes (Signed)
Pt states "I'm not breathing as she drifts off to sleep, FOB holding her hand. Pulse ox aplied, Taking BP. [facility] reassured she was jsut given pain med. Pt talking a lot during and between uc's.

## 2013-04-23 NOTE — Progress Notes (Signed)
Pt wants to wait a few more minutes before calling out for pain medicine but will get pain medicine soon.

## 2013-04-23 NOTE — Progress Notes (Signed)
Pt states " I can't breathe" while pushing -CNM aware [facility] Pulse ox applied. Pt screaming audibly during second stage. [facility] due to screaming while pushing. PT talking and color appears normal. Pt calmed and reassured.

## 2013-04-24 LAB — CBC
HCT: 23.3 % — ABNORMAL LOW (ref 36.0–46.0)
HEMOGLOBIN: 7.8 g/dL — AB (ref 12.0–15.0)
MCH: 26.3 pg (ref 26.0–34.0)
MCHC: 33.5 g/dL (ref 30.0–36.0)
MCV: 78.5 fL (ref 78.0–100.0)
PLATELETS: 182 10*3/uL (ref 150–400)
RBC: 2.97 MIL/uL — ABNORMAL LOW (ref 3.87–5.11)
RDW: 14.3 % (ref 11.5–15.5)
WBC: 15.4 10*3/uL — AB (ref 4.0–10.5)

## 2013-04-24 NOTE — Progress Notes (Signed)
Post Partum Day 1 Subjective: no complaints, up ad lib, voiding, tolerating PO and + flatus  Objective: Blood pressure 103/65, pulse 109, temperature 98.3 F (36.8 C), temperature source Oral, resp. rate 19, height 5\' 1"  (1.549 m), weight 74.571 kg (164 lb 6.4 oz), last menstrual period 06/20/2012, SpO2 100.00%, unknown if currently breastfeeding.  Physical Exam:  General: alert, cooperative, appears stated age and no distress Lochia: appropriate Uterine Fundus: firm DVT Evaluation: No evidence of DVT seen on physical exam. Negative Homan's sign. No cords or calf tenderness. No significant calf/ankle edema.   Recent Labs  04/23/13 0840 04/24/13 0550  HGB 12.0 7.8*  HCT 36.2 23.3*    Assessment/Plan: Plan for discharge tomorrow, Breastfeeding and Contraception mirena No acute concerns today. Doing well. Possible discharge this afternoon if patient desires.  LOS: 1 day   Kayren Holck RYAN 04/24/2013, 8:59 AM

## 2013-04-25 MED ORDER — IBUPROFEN 600 MG PO TABS: 600.0000 mg | ORAL_TABLET | Freq: Four times a day (QID) | ORAL | Status: DC

## 2013-04-25 MED ORDER — OXYCODONE-ACETAMINOPHEN 5-325 MG PO TABS: 1.0000 | ORAL_TABLET | ORAL | Status: DC | PRN

## 2013-04-25 NOTE — Discharge Summary (Signed)
Obstetric Discharge Summary Reason for Admission: onset of labor Prenatal Procedures: ultrasound Intrapartum Procedures: spontaneous vaginal delivery Postpartum Procedures: none Complications-Operative and Postpartum: 2nd degree perineal laceration Hemoglobin  Date Value Range Status  04/24/2013 7.8* 12.0 - 15.0 g/dL Final     DELTA CHECK NOTED     REPEATED TO VERIFY  10/13/2012 12.8   Final     HCT  Date Value Range Status  04/24/2013 23.3* 36.0 - 46.0 % Final  10/13/2012 38   Final    Physical Exam:  General: alert, cooperative, appears stated age and no distress Lochia: appropriate Uterine Fundus: firm Incision: perineum intact DVT Evaluation: No evidence of DVT seen on physical exam. Negative Homan's sign. No significant calf/ankle edema.  Discharge Diagnoses: Term Pregnancy-delivered  Discharge Information: Date: 04/25/2013 Activity: pelvic rest Diet: routine Medications: PNV, Ibuprofen and Percocet Condition: stable and improved Instructions: refer to practice specific booklet Discharge to: home   Newborn Data: Live born female  Birth Weight: 6 lb 3.1 oz (2810 g) APGAR: 9, 9  Home with mother.  Wyvonnia DuskyLAWSON, Ethanjames Fontenot DARLENE 04/25/2013, 9:24 AM

## 2013-04-28 ENCOUNTER — Encounter (HOSPITAL_COMMUNITY): Payer: Self-pay | Admitting: *Deleted

## 2013-05-22 ENCOUNTER — Encounter: Payer: Self-pay | Admitting: Nurse Practitioner

## 2013-05-28 ENCOUNTER — Ambulatory Visit: Payer: BC Managed Care – PPO | Admitting: Advanced Practice Midwife

## 2013-06-05 ENCOUNTER — Ambulatory Visit (INDEPENDENT_AMBULATORY_CARE_PROVIDER_SITE_OTHER): Payer: Medicaid Other | Admitting: Nurse Practitioner

## 2013-06-05 ENCOUNTER — Encounter: Payer: Self-pay | Admitting: Nurse Practitioner

## 2013-06-05 VITALS — BP 120/85 | HR 97 | Temp 97.3°F | Wt 146.8 lb

## 2013-06-05 DIAGNOSIS — IMO0001 Reserved for inherently not codable concepts without codable children: Secondary | ICD-10-CM

## 2013-06-05 DIAGNOSIS — Z975 Presence of (intrauterine) contraceptive device: Secondary | ICD-10-CM | POA: Insufficient documentation

## 2013-06-05 DIAGNOSIS — D649 Anemia, unspecified: Secondary | ICD-10-CM | POA: Insufficient documentation

## 2013-06-05 DIAGNOSIS — Z30430 Encounter for insertion of intrauterine contraceptive device: Secondary | ICD-10-CM

## 2013-06-05 LAB — POCT PREGNANCY, URINE: Preg Test, Ur: NEGATIVE

## 2013-06-05 MED ORDER — LEVONORGESTREL 20 MCG/24HR IU IUD
INTRAUTERINE_SYSTEM | Freq: Once | INTRAUTERINE | Status: AC
  Administered 2013-06-05: 11:00:00 1 via INTRAUTERINE

## 2013-06-05 NOTE — Patient Instructions (Signed)
Anemia, Nonspecific Anemia is a condition in which the concentration of red blood cells or hemoglobin in the blood is below normal. Hemoglobin is a substance in red blood cells that carries oxygen to the tissues of the body. Anemia results in not enough oxygen reaching these tissues.  CAUSES  Common causes of anemia include:   Excessive bleeding. Bleeding may be internal or external. This includes excessive bleeding from periods (in women) or from the intestine.   Poor nutrition.   Chronic kidney, thyroid, and liver disease.  Bone marrow disorders that decrease red blood cell production.  Cancer and treatments for cancer.  HIV, AIDS, and their treatments.  Spleen problems that increase red blood cell destruction.  Blood disorders.  Excess destruction of red blood cells due to infection, medicines, and autoimmune disorders. SIGNS AND SYMPTOMS   Minor weakness.   Dizziness.   Headache.  Palpitations.   Shortness of breath, especially with exercise.   Paleness.  Cold sensitivity.  Indigestion.  Nausea.  Difficulty sleeping.  Difficulty concentrating. Symptoms may occur suddenly or they may develop slowly.  DIAGNOSIS  Additional blood tests are often needed. These help your health care provider determine the best treatment. Your health care provider will check your stool for blood and look for other causes of blood loss.  TREATMENT  Treatment varies depending on the cause of the anemia. Treatment can include:   Supplements of iron, vitamin B12, or folic acid.   Hormone medicines.   A blood transfusion. This may be needed if blood loss is severe.   Hospitalization. This may be needed if there is significant continual blood loss.   Dietary changes.  Spleen removal. HOME CARE INSTRUCTIONS Keep all follow-up appointments. It often takes many weeks to correct anemia, and having your health care provider check on your condition and your response to  treatment is very important. SEEK IMMEDIATE MEDICAL CARE IF:   You develop extreme weakness, shortness of breath, or chest pain.   You become dizzy or have trouble concentrating.  You develop heavy vaginal bleeding.   You develop a rash.   You have bloody or black, tarry stools.   You faint.   You vomit up blood.   You vomit repeatedly.   You have abdominal pain.  You have a fever or persistent symptoms for more than 2 3 days.   You have a fever and your symptoms suddenly get worse.   You are dehydrated.  MAKE SURE YOU:  Understand these instructions.  Will watch your condition.  Will get help right away if you are not doing well or get worse. Document Released: 05/17/2004 Document Revised: 12/10/2012 Document Reviewed: 10/03/2012 [facility] Patient Information 2014 [place], [place]. Levonorgestrel intrauterine device (IUD) What is this medicine? LEVONORGESTREL IUD (LEE voe nor jes trel) is a contraceptive (birth control) device. The device is placed inside the uterus by a healthcare professional. It is used to prevent pregnancy and can also be used to treat heavy bleeding that occurs during your period. Depending on the device, it can be used for 3 to 5 years. This medicine may be used for other purposes; ask your health care provider or pharmacist if you have questions. COMMON BRAND NAME(S): Gretta Cool What should I tell my health care provider before I take this medicine? They need to know if you have any of these conditions: -abnormal Pap smear -cancer of the breast, uterus, or cervix -diabetes -endometritis -genital or pelvic infection now or in the past -have more than  one sexual partner or your partner has more than one partner -heart disease -history of an ectopic or tubal pregnancy -immune system problems -IUD in place -liver disease or tumor -problems with blood clots or take blood-thinners -use intravenous drugs -uterus of unusual  shape -vaginal bleeding that has not been explained -an unusual or allergic reaction to levonorgestrel, other hormones, silicone, or polyethylene, medicines, foods, dyes, or preservatives -pregnant or trying to get pregnant -breast-feeding How should I use this medicine? This device is placed inside the uterus by a health care professional. Talk to your pediatrician regarding the use of this medicine in children. Special care may be needed. Overdosage: If you think you have taken too much of this medicine contact a poison control center or emergency room at once. NOTE: This medicine is only for you. Do not share this medicine with others. What if I miss a dose? This does not apply. What may interact with this medicine? Do not take this medicine with any of the following medications: -amprenavir -bosentan -fosamprenavir This medicine may also interact with the following medications: -aprepitant -barbiturate medicines for inducing sleep or treating seizures -bexarotene -griseofulvin -medicines to treat seizures like carbamazepine, ethotoin, felbamate, oxcarbazepine, phenytoin, topiramate -modafinil -pioglitazone -rifabutin -rifampin -rifapentine -some medicines to treat HIV infection like atazanavir, indinavir, lopinavir, nelfinavir, tipranavir, ritonavir -St. John's wort -warfarin This list may not describe all possible interactions. Give your health care provider a list of all the medicines, herbs, non-prescription drugs, or dietary supplements you use. Also tell them if you smoke, drink alcohol, or use illegal drugs. Some items may interact with your medicine. What should I watch for while using this medicine? Visit your doctor or health care professional for regular check ups. See your doctor if you or your partner has sexual contact with others, becomes HIV positive, or gets a sexual transmitted disease. This product does not protect you against HIV infection (AIDS) or other  sexually transmitted diseases. You can check the placement of the IUD yourself by reaching up to the top of your vagina with clean fingers to feel the threads. Do not pull on the threads. It is a good habit to check placement after each menstrual period. Call your doctor right away if you feel more of the IUD than just the threads or if you cannot feel the threads at all. The IUD may come out by itself. You may become pregnant if the device comes out. If you notice that the IUD has come out use a backup birth control method like condoms and call your health care provider. Using tampons will not change the position of the IUD and are okay to use during your period. What side effects may I notice from receiving this medicine? Side effects that you should report to your doctor or health care professional as soon as possible: -allergic reactions like skin rash, itching or hives, swelling of the face, lips, or tongue -fever, flu-like symptoms -genital sores -high blood pressure -no menstrual period for 6 weeks during use -pain, swelling, warmth in the leg -pelvic pain or tenderness -severe or sudden headache -signs of pregnancy -stomach cramping -sudden shortness of breath -trouble with balance, talking, or walking -unusual vaginal bleeding, discharge -yellowing of the eyes or skin Side effects that usually do not require medical attention (report to your doctor or health care professional if they continue or are bothersome): -acne -breast pain -change in sex drive or performance -changes in weight -cramping, dizziness, or faintness while the device is being  inserted -headache -irregular menstrual bleeding within first 3 to 6 months of use -nausea This list may not describe all possible side effects. Call your doctor for medical advice about side effects. You may report side effects to FDA at 1-800-FDA-1088. Where should I keep my medicine? This does not apply. NOTE: This sheet is a  summary. It may not cover all possible information. If you have questions about this medicine, talk to your doctor, pharmacist, or health care provider.  2014, [employer] Standard. (2011-05-10 13:54:04)

## 2013-06-05 NOTE — Progress Notes (Signed)
History:  Kim Kirby is a 23 y.o. G1P1001 who [facility] today for postpartum and contraception. She delivered baby girl on 04/23/13 and is doing well with baby. She had NSVD with 2nd degree tear that has healed. She is still bleeding form delivery though it is improving. She had H/H in hospital of 7.8/23 and at times feels very tired. She did have near syncopal episode in hospital. She did not have epidural. She wants Mirena IUD inserted today.   The following portions of the patient's history were reviewed and updated as appropriate: allergies, current medications, past family history, past medical history, past social history, past surgical history and problem list.  Review of Systems:  Pertinent items are noted in HPI.  Objective:  Physical Exam BP 120/85  Pulse 97  Temp(Src) 97.3 F (36.3 C)  Wt 146 lb 12.8 oz (66.588 kg)  Breastfeeding? No GENERAL: Well-developed, well-nourished female in no acute distress.  HEENT: Normocephalic, atraumatic.  NECK: Supple. Normal thyroid.  LUNGS: Normal rate. Clear to auscultation bilaterally.  HEART: Regular rate and rhythm with no adventitious sounds.  BREASTS: Symmetric in size. No masses, skin changes, nipple drainage, or lymphadenopathy. ABDOMEN: Soft, nontender, nondistended. No organomegaly. Normal bowel sounds appreciated in all quadrants.  PELVIC: Normal external female genitalia. Vagina is pink and rugated.  Normal discharge. Normal cervix contour. Uterus is normal in size. No adnexal mass or tenderness.  EXTREMITIES: No cyanosis, clubbing, or edema, 2+ distal pulses.   Labs and Imaging No results found. Results for orders placed in visit on 06/05/13 (from the past 24 hour(s))  POCT PREGNANCY, URINE     Status: None   Collection Time    06/05/13 11:05 AM      Result Value Ref Range   Preg Test, Ur NEGATIVE  NEGATIVE    IUD Procedure Note/ Mirena  Patient identified, informed consent performed, signed  copy in chart, time out was performed.  Urine pregnancy test negative.  Speculum placed in the vagina.  Cervix visualized.  Cleaned with Betadine x 2.  Grasped anteriorly with a single tooth tenaculum.  Uterus sounded to 6 cm.  Mirena IUD placed per manufacturer's recommendations.  Strings trimmed to 3 cm. Tenaculum was removed, good hemostasis noted.  Patient tolerated procedure well.   Patient given post procedure instructions  Patient is asked to check IUD strings periodically and follow up in 4-6 weeks for IUD check.    Assessment & Plan:  Assessment:  Postpartum care Contraception Anemia  Plans:  Advised to restart PNV daily and eat diet high in iron Mirena IUD inserted today without issues. Motrin for pain Doing well with baby  Delbert PhenixLinda M Sache Sane, NP 06/05/2013 11:27 AM

## 2013-07-13 ENCOUNTER — Encounter: Payer: Self-pay | Admitting: *Deleted

## 2013-07-17 ENCOUNTER — Ambulatory Visit: Payer: Medicaid Other | Admitting: Medical

## 2013-10-05 ENCOUNTER — Encounter (HOSPITAL_COMMUNITY): Payer: Self-pay | Admitting: Emergency Medicine

## 2013-10-05 ENCOUNTER — Emergency Department (HOSPITAL_COMMUNITY)
Admission: EM | Admit: 2013-10-05 | Discharge: 2013-10-05 | Disposition: A | Discharge status: Home / Self Care | Payer: Medicaid Other | Attending: Emergency Medicine | Admitting: Emergency Medicine

## 2013-10-05 DIAGNOSIS — Z791 Long term (current) use of non-steroidal anti-inflammatories (NSAID): Secondary | ICD-10-CM | POA: Insufficient documentation

## 2013-10-05 DIAGNOSIS — Y929 Unspecified place or not applicable: Secondary | ICD-10-CM | POA: Insufficient documentation

## 2013-10-05 DIAGNOSIS — Y939 Activity, unspecified: Secondary | ICD-10-CM | POA: Insufficient documentation

## 2013-10-05 DIAGNOSIS — S0180XA Unspecified open wound of other part of head, initial encounter: Secondary | ICD-10-CM | POA: Insufficient documentation

## 2013-10-05 DIAGNOSIS — Z23 Encounter for immunization: Secondary | ICD-10-CM | POA: Insufficient documentation

## 2013-10-05 DIAGNOSIS — J45909 Unspecified asthma, uncomplicated: Secondary | ICD-10-CM | POA: Insufficient documentation

## 2013-10-05 DIAGNOSIS — IMO0002 Reserved for concepts with insufficient information to code with codable children: Secondary | ICD-10-CM | POA: Insufficient documentation

## 2013-10-05 DIAGNOSIS — Z87891 Personal history of nicotine dependence: Secondary | ICD-10-CM | POA: Insufficient documentation

## 2013-10-05 DIAGNOSIS — S01502A Unspecified open wound of oral cavity, initial encounter: Secondary | ICD-10-CM | POA: Insufficient documentation

## 2013-10-05 MED ORDER — CEPHALEXIN 500 MG PO CAPS: 500.0000 mg | ORAL_CAPSULE | Freq: Four times a day (QID) | ORAL | Status: DC

## 2013-10-05 MED ORDER — TETANUS-DIPHTH-ACELL PERTUSSIS 5-2.5-18.5 LF-MCG/0.5 IM SUSP
0.5000 mL | Freq: Once | INTRAMUSCULAR | Status: AC
  Administered 2013-10-05: 0.5 mL via INTRAMUSCULAR
  Filled 2013-10-05: qty 0.5

## 2013-10-05 NOTE — ED Notes (Signed)
Hit chin with corner of car door 5 days ago. Has small lac below left lower lip. Pt unsure of tetanus status.

## 2013-10-05 NOTE — ED Provider Notes (Signed)
CSN: [id number]     Arrival date & time 10/05/13  1056 History  This chart was scribed for non-physician practitioner Teressa LowerVrinda Sharalyn Lomba, NP working with Gerhard Munchobert Lockwood, MD by Joaquin MusicKristina Sanchez-Matthews, ED Scribe. This patient was seen in room TR08C/TR08C and the patient's care was started at 11:09 AM .   Chief Complaint  Patient presents with  . Mouth Injury   The history is provided by the patient. No language interpreter was used.   HPI Comments: Kim Kirby is a 23 y.o. female who presents to the Emergency Department complaining of mouth injury that occurred 5 days ago. Pt states she hit her chin on the corner of her car door. States she has noticed her chin become infected and states she had minimal drainage initially but denies any other drainage. Unsure of last Tetanus. Denies fever, chills, and new sx.  Past Medical History  Diagnosis Date  . Asthma   . Seasonal allergic rhinitis     spring  . Wears glasses    No past surgical history on file. Family History  Problem Relation Age of Onset  . Hypertension Mother   . Other Father     unknown  . Cancer Maternal Aunt     lung  . Heart disease Neg Hx   . Diabetes Neg Hx   . Stroke Neg Hx    History  Substance Use Topics  . Smoking status: Former Games developermoker  . Smokeless tobacco: Never Used  . Alcohol Use: Yes     Comment: sometimes   OB History   Grav Para Term Preterm Abortions TAB SAB Ect Mult Living   1 1 1       1      Review of Systems  Constitutional: Negative for fever and chills.  Skin: Positive for wound. Negative for color change.  All other systems reviewed and are negative.  Allergies  Review of patient's allergies indicates no known allergies.  Home Medications   Prior to Admission medications   Medication Sig Start Date End Date Taking? Authorizing Provider  ibuprofen (ADVIL,MOTRIN) 600 MG tablet Take 1 tablet (600 mg total) by mouth every 6 (six) hours. 04/25/13   Ferdie PingMarie Darlene Lawson, CNM   oxyCODONE-acetaminophen (PERCOCET/ROXICET) 5-325 MG per tablet Take 1-2 tablets by mouth every 4 (four) hours as needed for severe pain (moderate - severe pain). 04/25/13   Ferdie PingMarie Darlene Lawson, CNM  Pediatric Multiple Vit-C-FA (FLINSTONES GUMMIES OMEGA-3 DHA PO) Take 2 tablets by mouth daily.    Historical Provider, MD   BP 118/82  Pulse 90  Temp(Src) 98.7 F (37.1 C) (Oral)  SpO2 100%  Physical Exam  Nursing note and vitals reviewed. Constitutional: She is oriented to person, place, and time. She appears well-developed and well-nourished. No distress.  HENT:  Head: Normocephalic and atraumatic.  Eyes: EOM are normal.  Neck: Neck supple. No tracheal deviation present.  Cardiovascular: Normal rate.   Pulmonary/Chest: Effort normal. No respiratory distress.  Musculoskeletal: Normal range of motion.  Neurological: She is alert and oriented to person, place, and time.  Skin: Skin is warm and dry.  Laceration to the chin that is not through-and-though. No drainage. No lip involvement.generalized swelling noted to the area  Psychiatric: She has a normal mood and affect. Her behavior is normal.    ED Course  Procedures  DIAGNOSTIC STUDIES: Oxygen Saturation is 100% on RA, normal by my interpretation.    COORDINATION OF CARE: 11:10 AM-Discussed treatment plan which includes discharge pt with antibiotics and update  tetanus during today's visit. Pt agreed to plan.   Labs Review Labs Reviewed - No data to display  Imaging Review No results found.   EKG Interpretation None     MDM   Final diagnoses:  Laceration    Will update tetanus and treat with antibiotics. No drainage noted to the area. Will treat for early infection  I personally performed the services described in this documentation, which was scribed in my presence. The recorded information has been reviewed and is accurate.    Teressa LowerVrinda Vyncent Overby, NP 10/05/13 1118

## 2013-10-05 NOTE — ED Provider Notes (Signed)
  Medical screening examination/treatment/procedure(s) were performed by non-physician practitioner and as supervising physician I was immediately available for consultation/collaboration.   EKG Interpretation None         Analyn Matusek, MD 10/05/13 1228 

## 2013-10-05 NOTE — Discharge Instructions (Signed)

## 2013-10-06 ENCOUNTER — Telehealth: Payer: Self-pay | Admitting: Family Medicine

## 2013-10-08 NOTE — Telephone Encounter (Signed)
dt ?

## 2014-02-22 ENCOUNTER — Encounter (HOSPITAL_COMMUNITY): Payer: Self-pay | Admitting: Emergency Medicine

## 2014-12-08 IMAGING — US US OB DETAIL+14 WK
1 series · 12 of 28 positions shown · non-contrast
Comparison: none

[Series 1: us ob detail +14 wk · 12 of 74 slices shown]
[im 3/74]
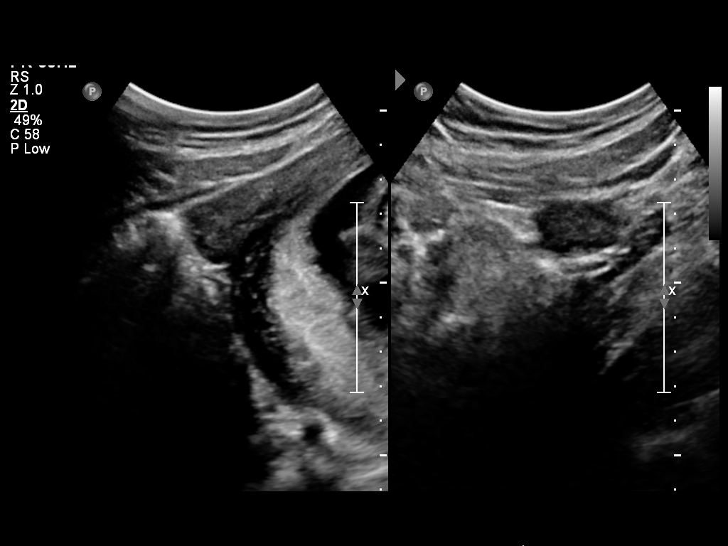
[im 9/74]
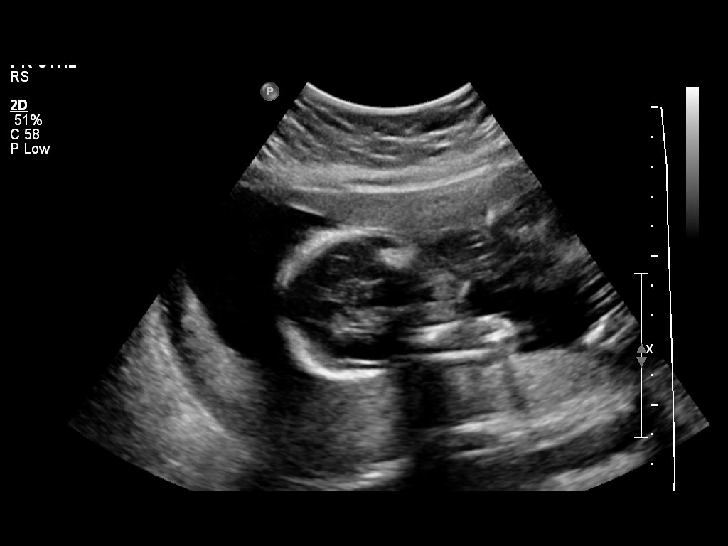
[im 14/74]
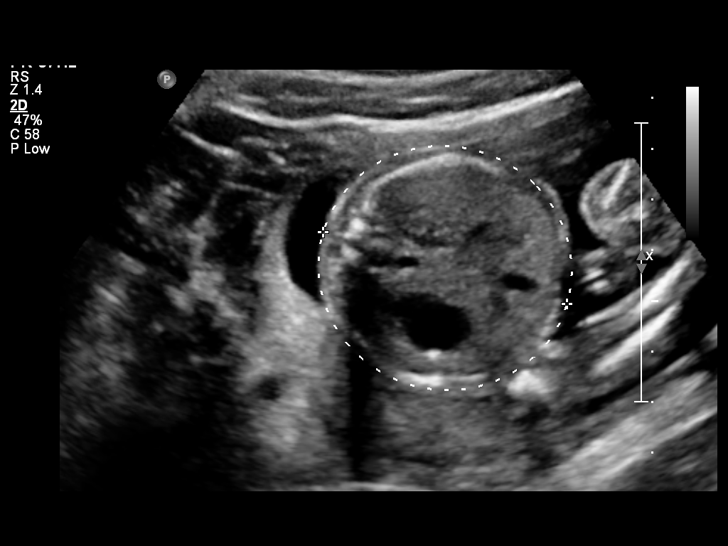
[im 22/74]
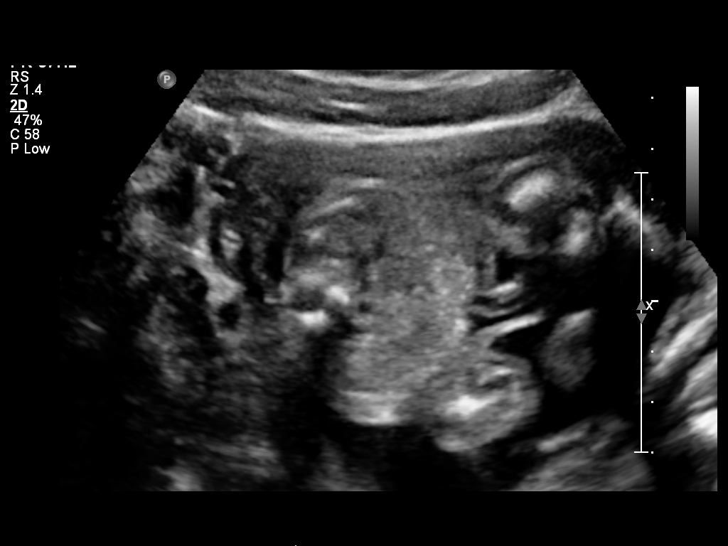
[im 28/74]
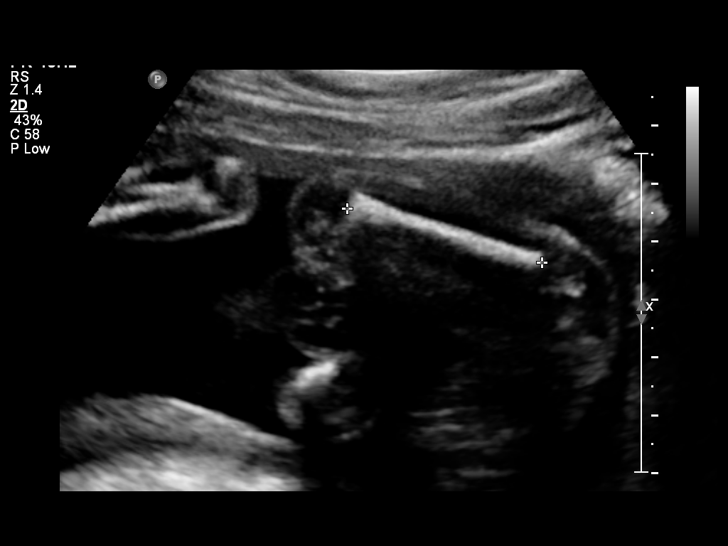
[im 33/74]
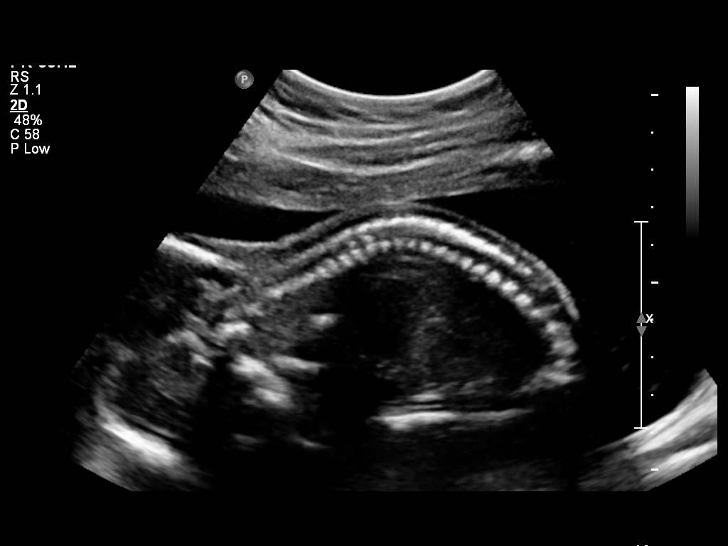
[im 41/74]
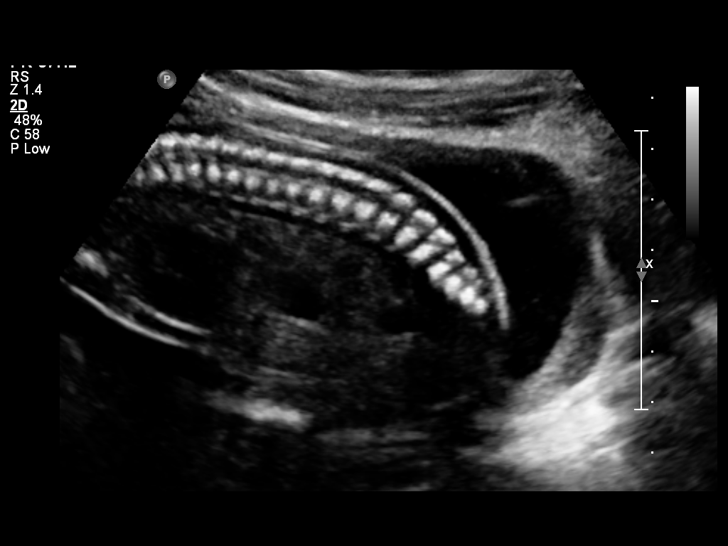
[im 46/74]
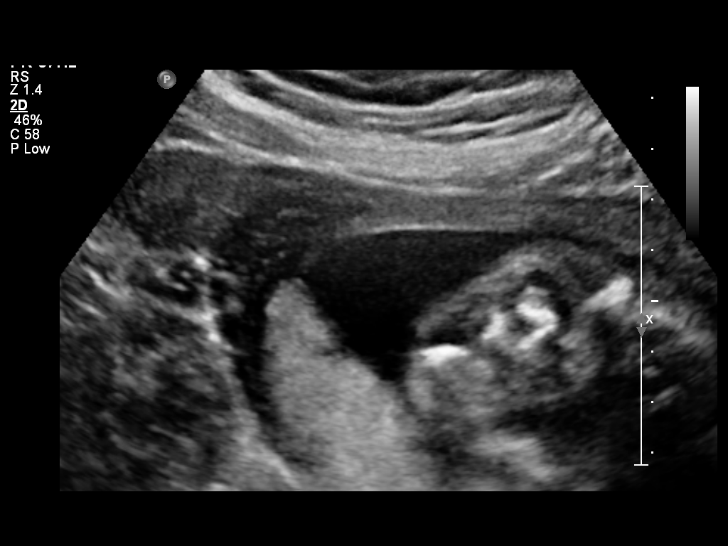
[im 52/74]
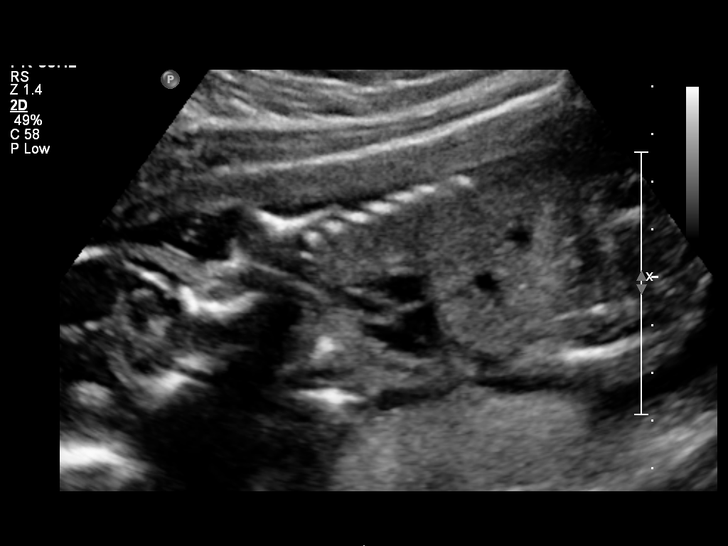
[im 60/74]
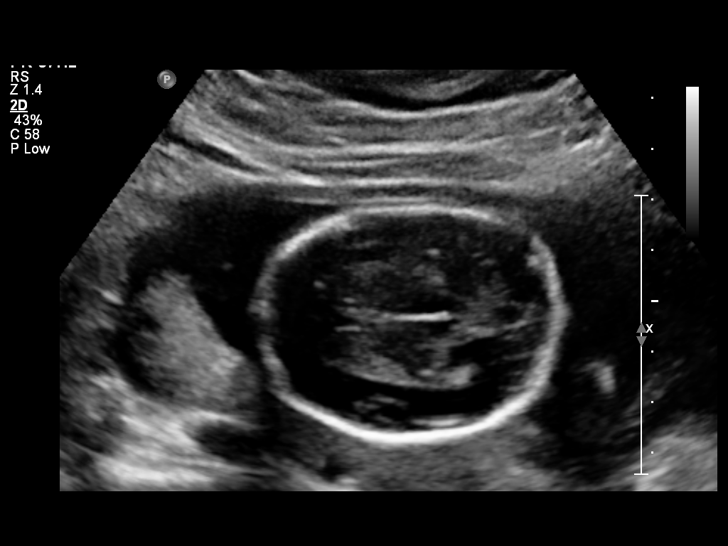
[im 65/74]
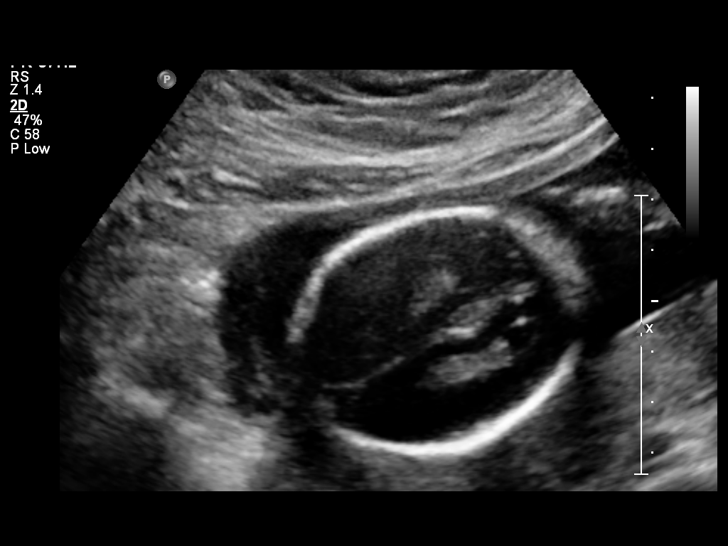
[im 71/74]
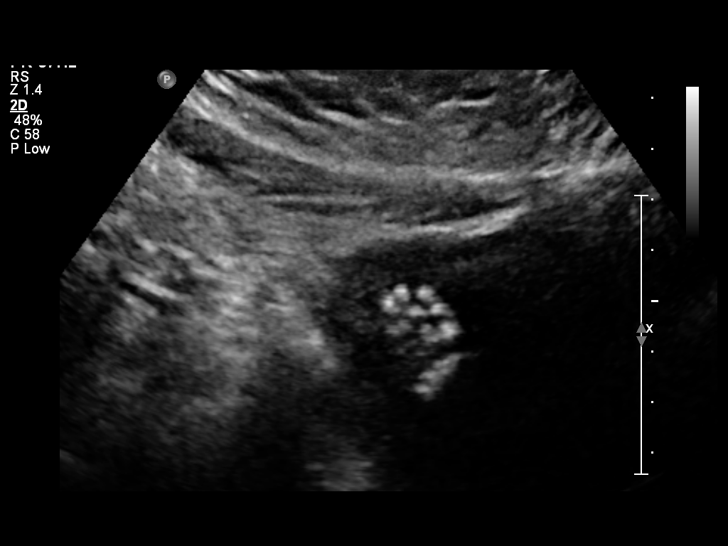

[12 of 28 positions shown; findings below may reference images not displayed]

OBSTETRICS REPORT
                      (Signed Final 12/16/2012 [DATE])

Service(s) Provided

 US OB DETAIL + 14 WK                                  76811.0
Indications

 Detailed fetal anatomic survey
Fetal Evaluation

 Num Of Fetuses:    1
 Fetal Heart Rate:  150                         bpm
 Cardiac Activity:  Observed
 Presentation:      Breech
 Placenta:          Posterior, above cervical
                    os
 P. Cord            Visualized, central
 Insertion:

 Amniotic Fluid
 AFI FV:      Subjectively within normal limits
                                             Larg Pckt:   7.28   cm
 RLQ:   7.28   cm
Biometry

 BPD:     45.3  mm    G. Age:   19w 5d                CI:        70.08   70 - 86
                                                      FL/HC:      19.9   15.9 -

 HC:     172.6  mm    G. Age:   19w 6d        4  %    HC/AC:      1.13   1.06 -

 AC:     153.4  mm    G. Age:   20w 4d       24  %    FL/BPD:
 FL:      34.4  mm    G. Age:   20w 6d       30  %    FL/AC:      22.4   20 - 24
 HUM:     34.7  mm    G. Age:   21w 6d       66  %
 Est. FW:     361  gm    0 lb 13 oz      31  %
Gestational Age

 U/S Today:     20w 2d                                        EDD:   05/03/13
 Best:          21w 1d    Det. By:   Early Ultrasound         EDD:   04/27/13
                                     (09/24/12)
Anatomy

 Cranium:          Appears normal         Aortic Arch:      Appears normal
 Fetal Cavum:      Appears normal         Ductal Arch:      Not well visualized
 Ventricles:       Appears normal         Diaphragm:        Appears normal
 Choroid Plexus:   Appears normal         Stomach:          Appears normal, left
                                                            sided
 Cerebellum:       Appears normal         Abdomen:          Appears normal
 Posterior Fossa:  Appears normal         Abdominal Wall:   Appears nml (cord
                                                            insert, abd wall)
 Nuchal Fold:      Not applicable (>20    Cord Vessels:     Appears normal (3
                   wks GA)                                  vessel cord)
 Face:             Not well visualized    Kidneys:          Appear normal
 Lips:             Appears normal         Bladder:          Appears normal
 Heart:            Appears normal         Spine:            Appears normal
                   (4CH, axis, and
                   situs)
 RVOT:             Not well visualized    Lower             Appears normal
                                          Extremities:
 LVOT:             Appears normal         Upper             Appears normal
                                          Extremities:

 Other:  Female gender. Heels and 5th digit visualized.
Cervix Uterus Adnexa

 Cervical Length:   3.4       cm

 Cervix:       Normal appearance by transabdominal scan.
 Uterus:       No abnormality visualized.
 Left Ovary:   No adnexal mass visualized.
 Right Ovary:  No adnexal mass visualized.

 Adnexa:     No abnormality visualized.
Impression

 Single live IUP in breech presentation.   Concordant
 measurements/assigned GA by US.
 No anomaly seen in visualized structures as listed above.
 Ductal arch, RVOT, nose/lips not well visualized due to fetal
 position. Consider reassessment in 1-2 weeks.

 questions or concerns.

## 2014-12-23 IMAGING — US US OB FOLLOW-UP
1 series · 12 of 28 positions shown · non-contrast
Comparison: none

[Series 1: us ob follow up · 12 of 46 slices shown]
[im 2/46]
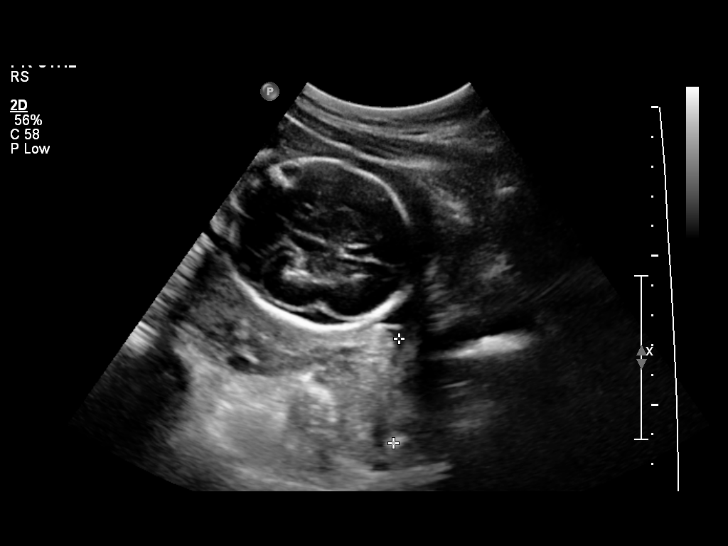
[im 6/46]
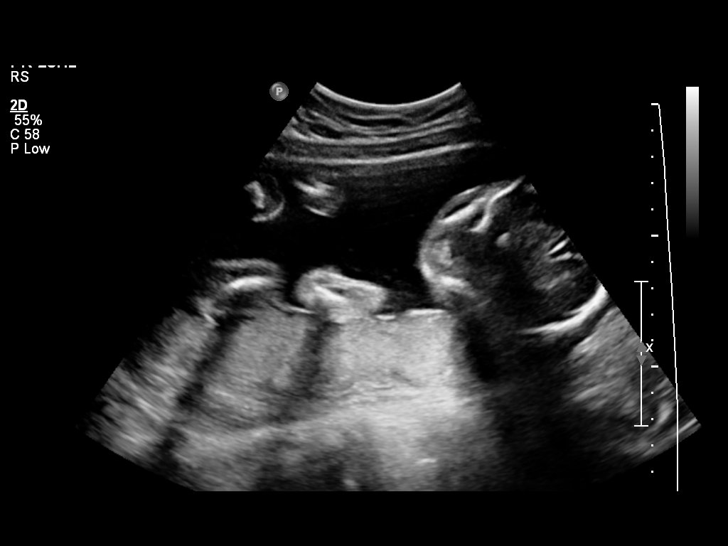
[im 9/46]
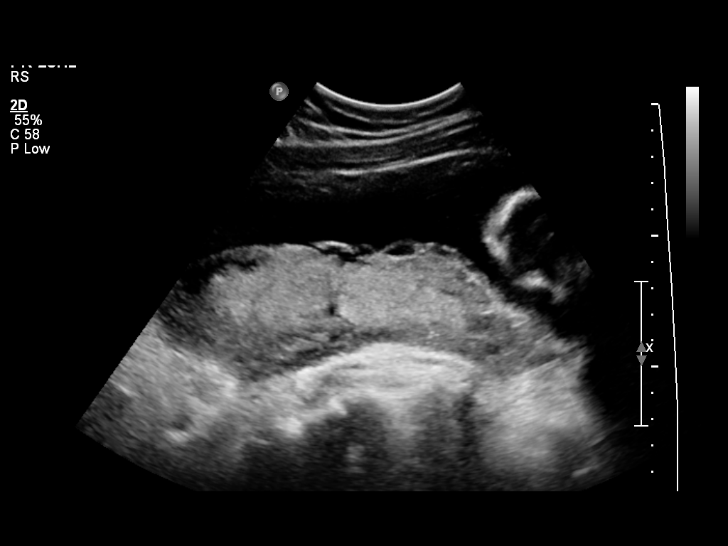
[im 14/46]
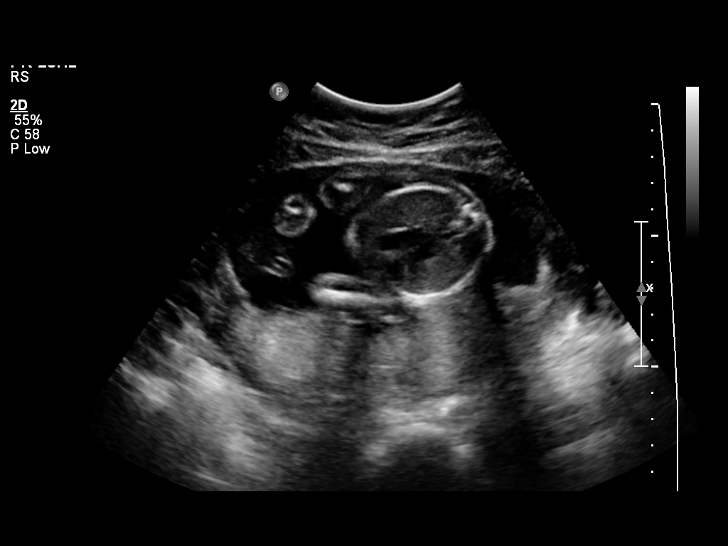
[im 17/46]
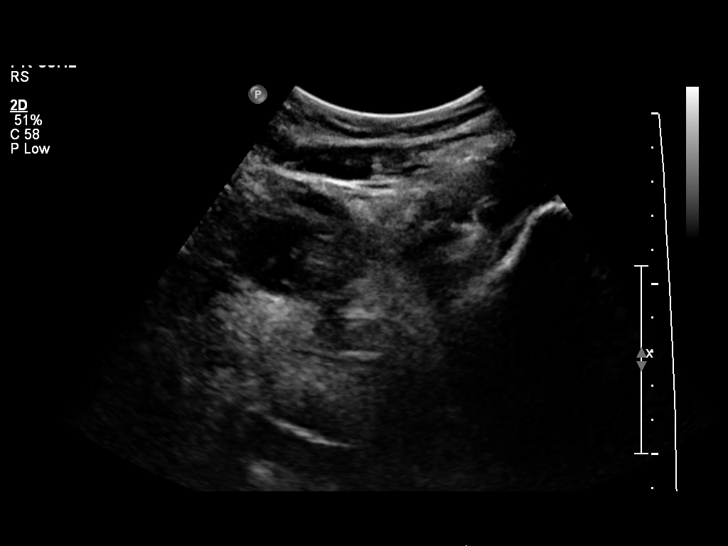
[im 21/46]
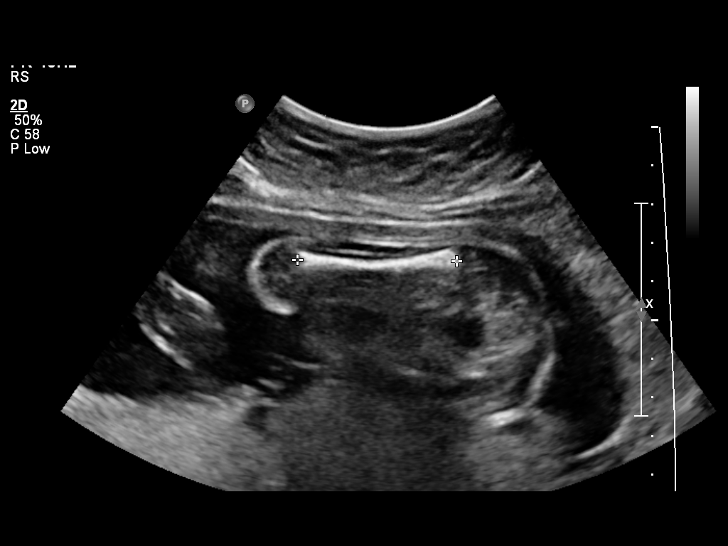
[im 26/46]
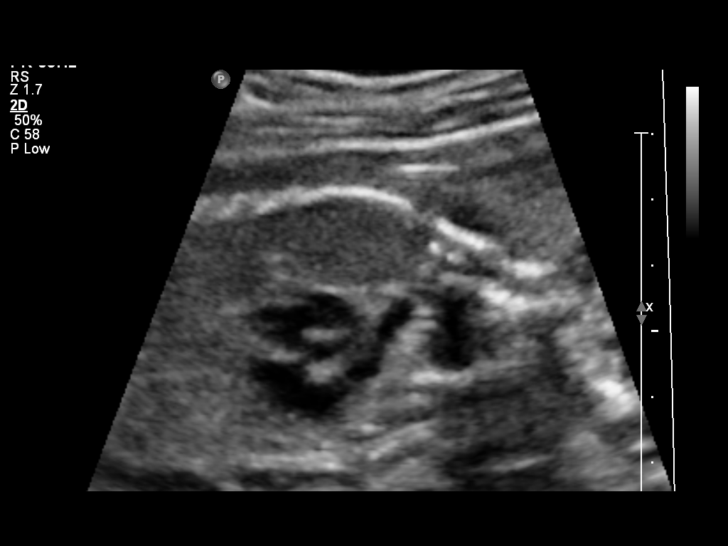
[im 29/46]
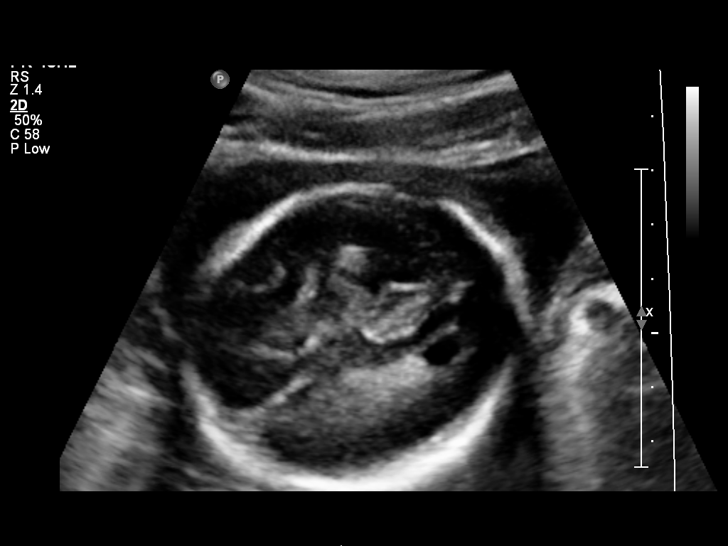
[im 32/46]
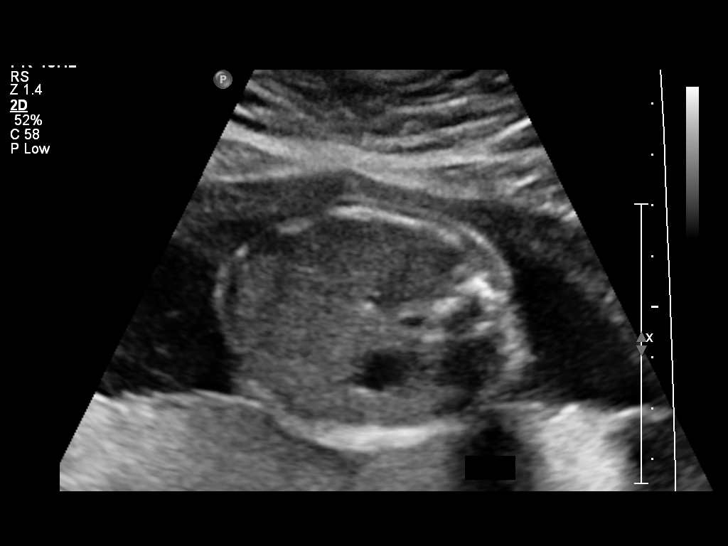
[im 37/46]
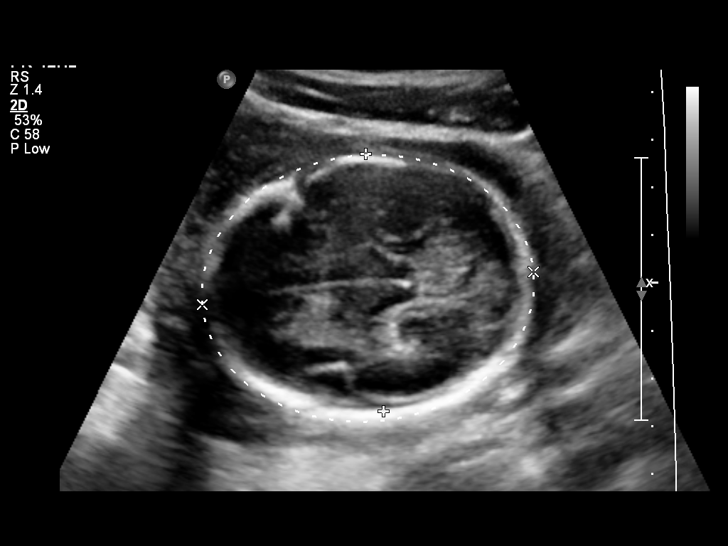
[im 41/46]
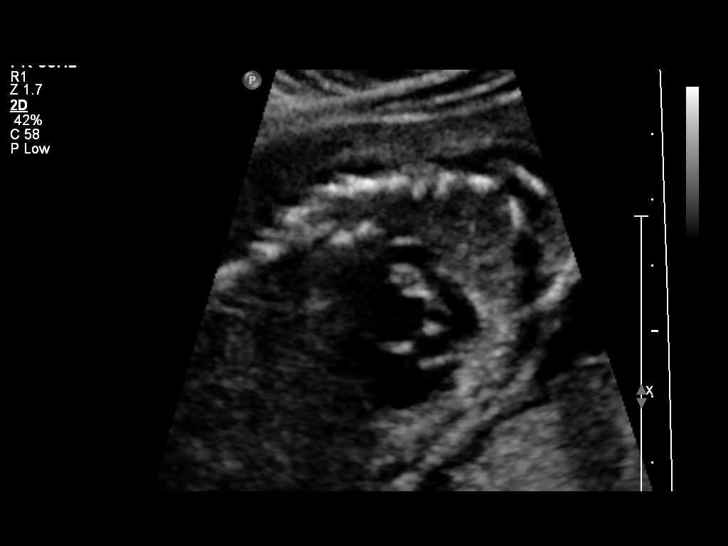
[im 44/46]
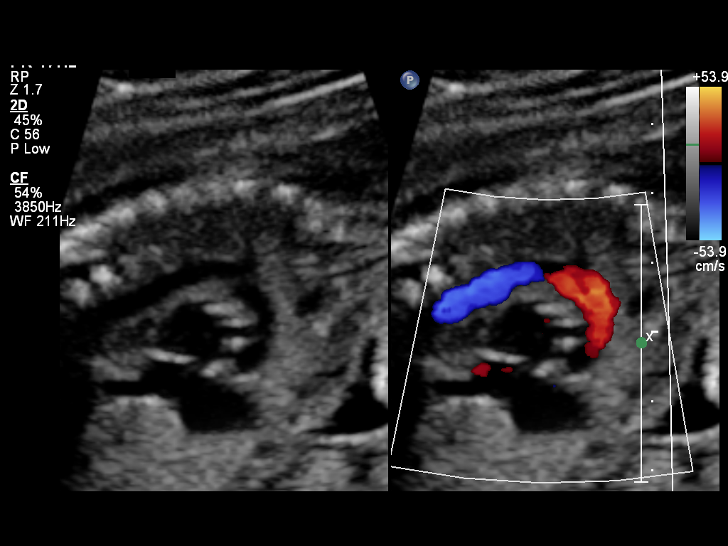

[12 of 28 positions shown; findings below may reference images not displayed]

OBSTETRICS REPORT
                      (Signed Final 12/31/2012 [DATE])

Service(s) Provided

 US OB FOLLOW UP                                       76816.1
Indications

 Evaluate anatomy not seen on prior sonogram
Fetal Evaluation

 Num Of Fetuses:    1
 Fetal Heart Rate:  147                          bpm
 Cardiac Activity:  Observed
 Presentation:      Cephalic
 Placenta:          Posterior, above cervical
                    os
 P. Cord            Visualized
 Insertion:

 Amniotic Fluid
 AFI FV:      Subjectively within normal limits
                                             Larg Pckt:     4.0  cm
Biometry

 BPD:     53.2  mm     G. Age:  22w 1d                CI:         73.6   70 - 86
                                                      FL/HC:      20.9   19.2 -

 HC:       197  mm     G. Age:  21w 6d      < 3  %    HC/AC:      1.16   1.05 -

 AC:     169.5  mm     G. Age:  22w 0d        9  %    FL/BPD:     77.3   71 - 87
 FL:      41.1  mm     G. Age:  23w 2d       40  %    FL/AC:      24.2   20 - 24

 Est. FW:     508  gm      1 lb 2 oz     33  %
Gestational Age

 U/S Today:     22w 2d                                        EDD:   05/04/13
 Best:          23w 2d     Det. By:  Early Ultrasound         EDD:   04/27/13
                                     (09/24/12)
Anatomy

 Cranium:          Appears normal         Aortic Arch:      Previously seen
 Fetal Cavum:      Appears normal         Ductal Arch:      Appears normal
 Ventricles:       Appears normal         Diaphragm:        Previously seen
 Choroid Plexus:   Previously seen        Stomach:          Appears normal, left
                                                            sided
 Cerebellum:       Previously seen        Abdomen:          Appears normal
 Posterior Fossa:  Previously seen        Abdominal Wall:   Previously seen
 Nuchal Fold:      Not applicable (>20    Cord Vessels:     Previously seen
                   wks GA)
 Face:             Not well visualized    Kidneys:          Appear normal
 Lips:             Appears normal         Bladder:          Appears normal
 Heart:            Appears normal         Spine:            Previously seen
                   (4CH, axis, and
                   situs)
 RVOT:             Appears normal         Lower             Previously seen
                                          Extremities:
 LVOT:             Previously seen        Upper             Previously seen
                                          Extremities:

 Other:  Female gender. Heels and 5th digit previously seen.
Cervix Uterus Adnexa

 Cervical Length:    3.5      cm

 Cervix:       Normal appearance by transabdominal scan.
 Uterus:       No abnormality visualized.
 Cul De Sac:   No free fluid seen.
 Left Ovary:    Not visualized.
 Right Ovary:   Not visualized.

 Adnexa:     No abnormality visualized.
Impression

 Active SIUP at 23 [DATE]wks for follow up
 EFW 33%'le
 Face remains not well seen
Recommendations

 Follow up interval growth and attempt to complete anatomic
 survey in 4 weeks.

 questions or concerns.

## 2015-02-02 IMAGING — US US FETAL BPP W/O NONSTRESS
1 series · 12 of 28 positions shown · non-contrast
Comparison: none

[Series 1: us ob follow up · 12 of 57 slices shown]
[im 3/57]
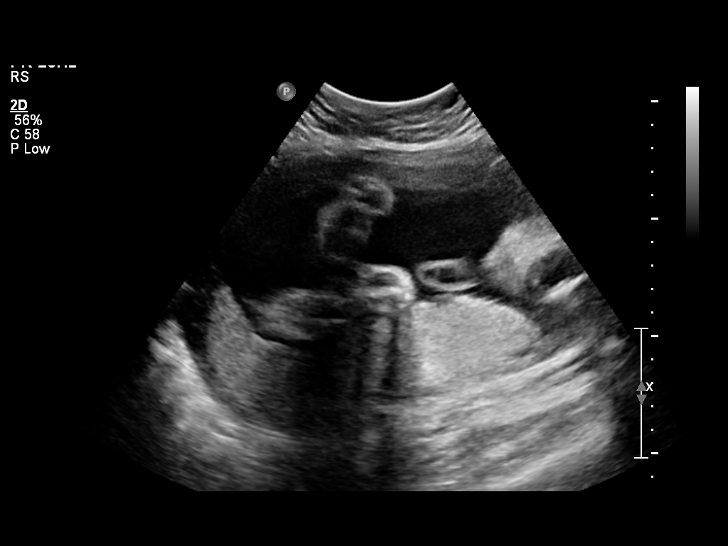
[im 7/57]
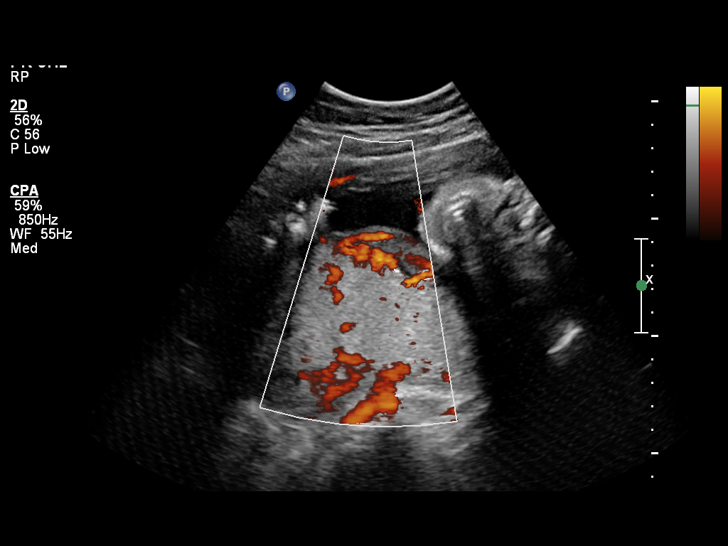
[im 11/57]
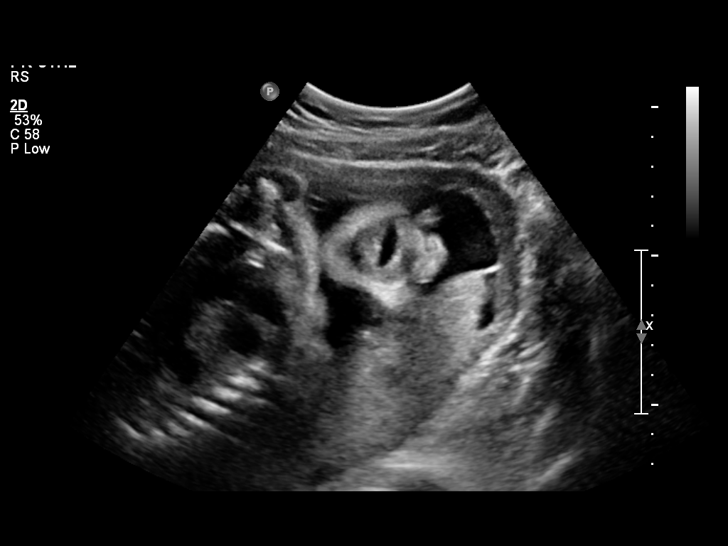
[im 17/57]
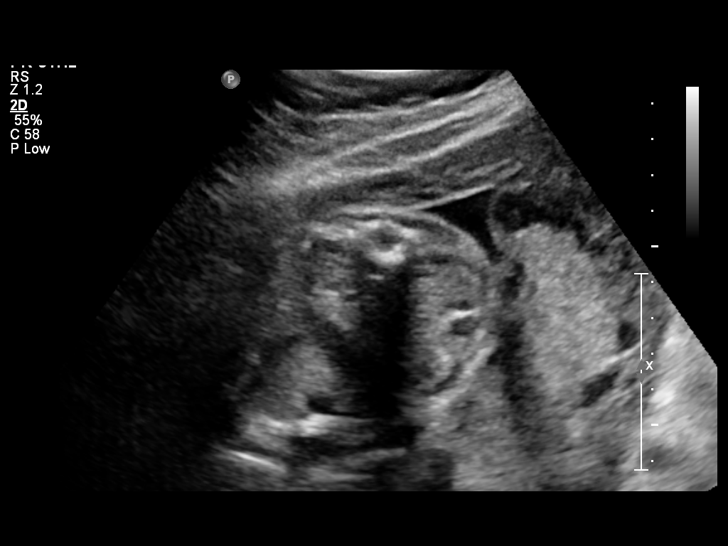
[im 21/57]
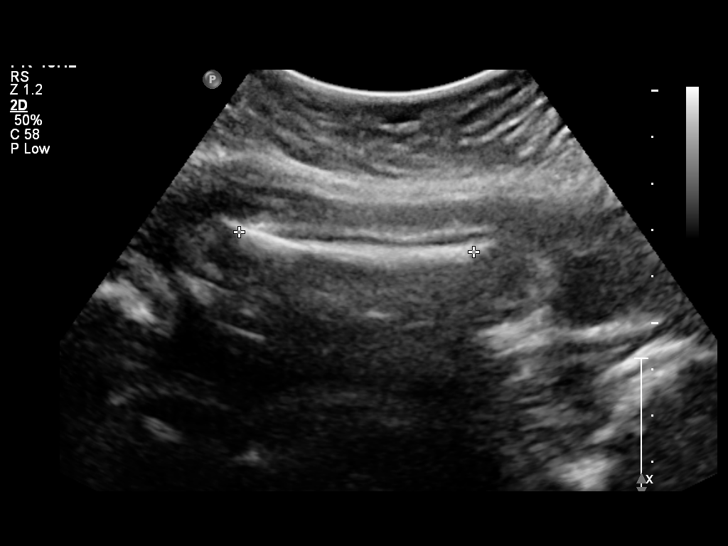
[im 25/57]
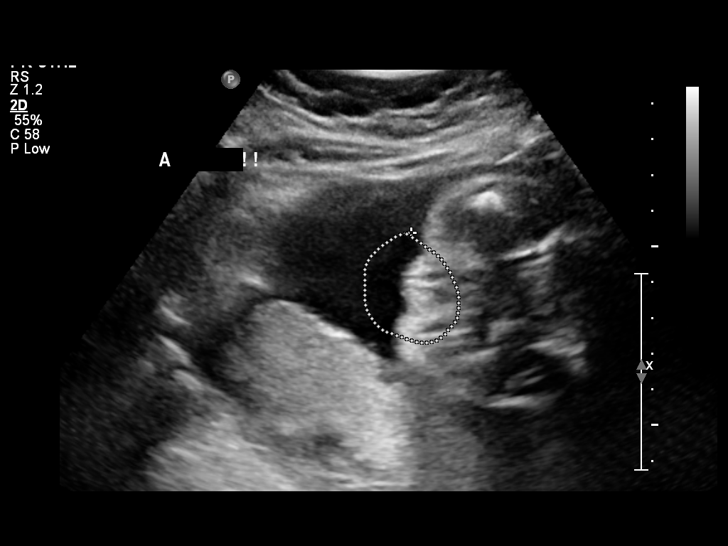
[im 32/57]
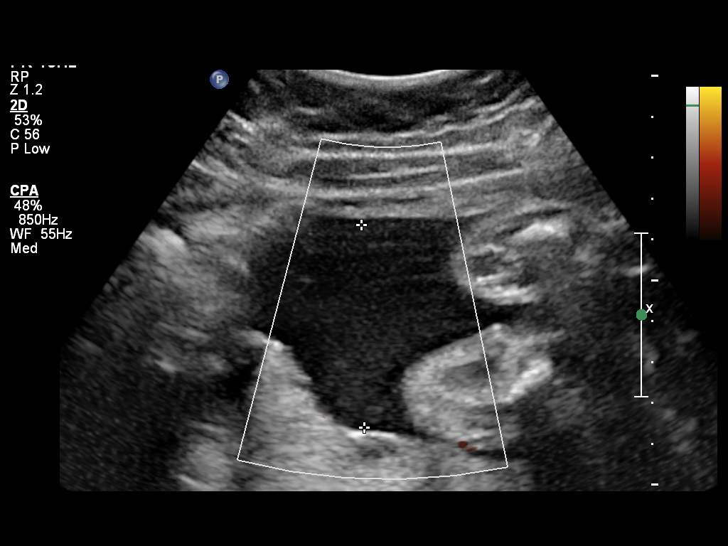
[im 36/57]
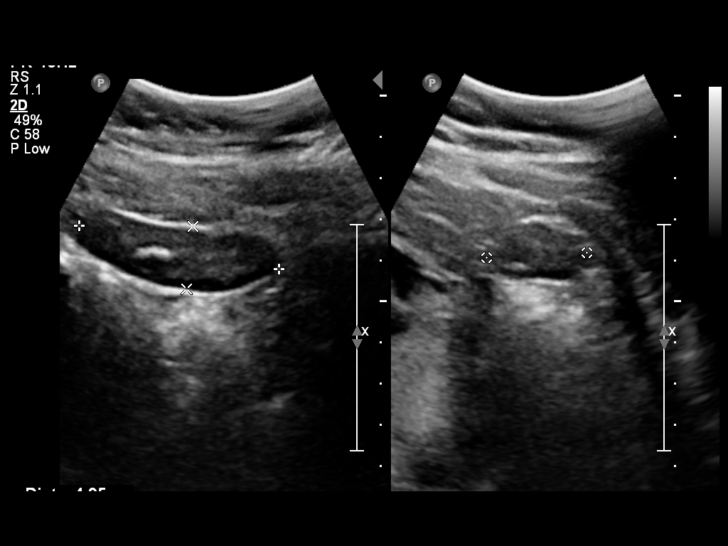
[im 40/57]
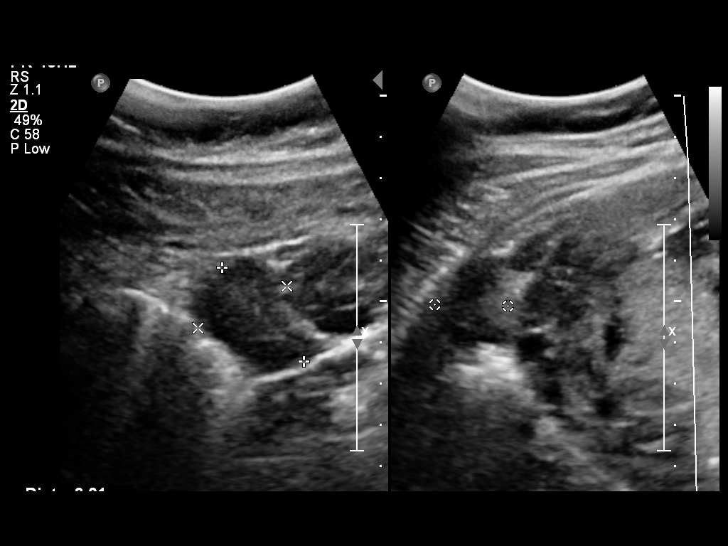
[im 46/57]
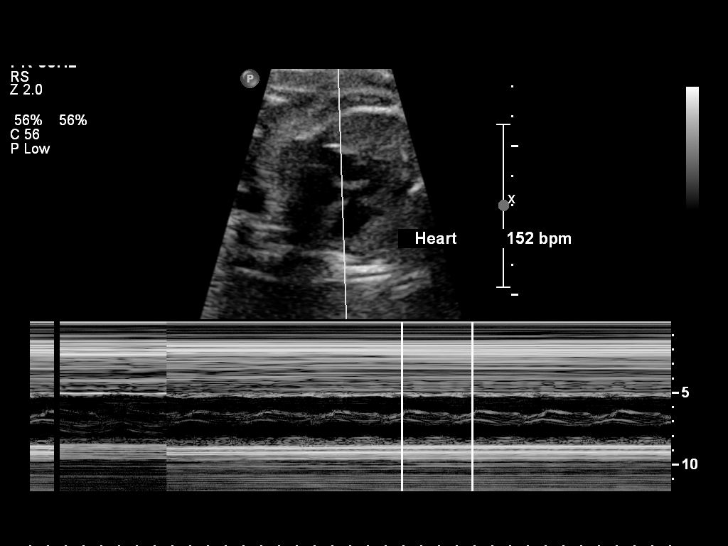
[im 50/57]
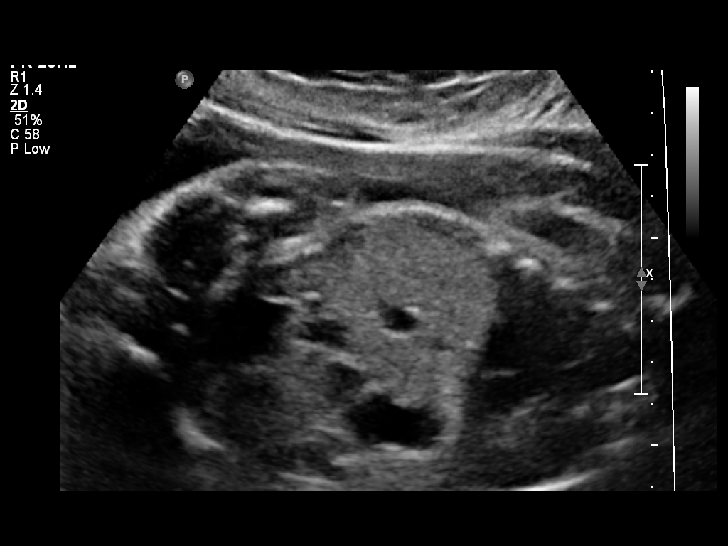
[im 54/57]
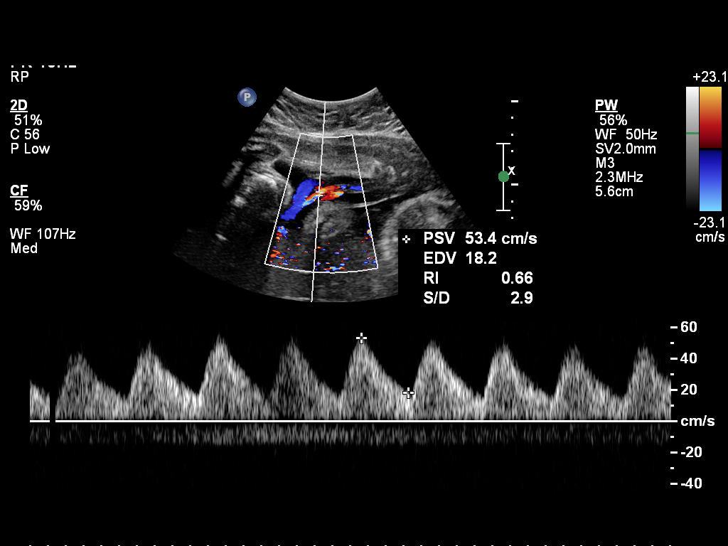

[12 of 28 positions shown; findings below may reference images not displayed]

OBSTETRICS REPORT

                                                         CNM
Service(s) Provided

 US OB FOLLOW UP                                       76816.1
 US UA CORD DOPPLER                                    76820.0
Indications

 Evaluate anatomy not seen on prior sonogram
 Intrauterine Growth Restriction
Fetal Evaluation

 Num Of Fetuses:    1
 Fetal Heart Rate:  152                          bpm
 Cardiac Activity:  Observed
 Presentation:      Cephalic
 Placenta:          Posterior, above cervical
                    os
 P. Cord            Previously Visualized
 Insertion:

 Amniotic Fluid
 AFI FV:      Subjectively within normal limits
 AFI Sum:     13.44   cm       41  %Tile     Larg Pckt:    4.93  cm
 RUQ:   4.93    cm   RLQ:    1.89   cm    LUQ:   2.84    cm   LLQ:    3.78   cm
Biophysical Evaluation

 Amniotic F.V:   Pocket => 2 cm two         F. Tone:        Observed
                 planes
 F. Movement:    Observed                   Score:          [DATE]
 F. Breathing:   Not Observed
Biometry

 BPD:       70  mm     G. Age:  28w 1d                CI:        76.11   70 - 86
                                                      FL/HC:      21.7   19.6 -

 HC:     254.3  mm     G. Age:  27w 4d      < 3  %    HC/AC:      1.12   0.99 -

 AC:     226.9  mm     G. Age:  27w 1d        4  %    FL/BPD:     79.0   71 - 87
 FL:      55.3  mm     G. Age:  29w 1d       36  %    FL/AC:      24.4   20 - 24
 HUM:     50.2  mm     G. Age:  29w 3d       51  %

 Est. FW:    0091  gm      2 lb 8 oz     28  %
Gestational Age

 U/S Today:     28w 0d                                        EDD:   05/05/13
 Best:          29w 1d     Det. By:  Early Ultrasound         EDD:   04/27/13
                                     (09/24/12)
Anatomy

 Cranium:          Appears normal         Aortic Arch:      Previously seen
 Fetal Cavum:      Appears normal         Ductal Arch:      Appears normal
 Ventricles:       Appears normal         Diaphragm:        Appears normal
 Choroid Plexus:   Previously seen        Stomach:          Appears normal, left
                                                            sided
 Cerebellum:       Previously seen        Abdomen:          Appears normal
 Posterior Fossa:  Previously seen        Abdominal Wall:   Previously seen
 Nuchal Fold:      Not applicable (>20    Cord Vessels:     Previously seen
                   wks GA)
 Face:             Appears normal         Kidneys:          Appear normal
                   (orbits and profile)
 Lips:             Appears normal         Bladder:          Appears normal
 Heart:            Appears normal         Spine:            Previously seen
                   (4CH, axis, and
                   situs)
 RVOT:             Previously seen        Lower             Previously seen
                                          Extremities:
 LVOT:             Appears normal         Upper             Previously seen
                                          Extremities:

 Other:  Female gender. Heels and 5th digit previously seen.
Doppler - Fetal Vessels

 Umbilical Artery
 S/D:   2.9            50  %tile
 Umbilical Artery
 Absent DFV:    No     Reverse DFV:    No

Cervix Uterus Adnexa

 Cervix:       Not visualized (advanced GA >09wks)
 Uterus:       No abnormality visualized.
 Cul De Sac:   No free fluid seen.
 Left Ovary:    Dermoid cyst measuring 1.0 x 0.4 x 0.7cm.
 Right Ovary:   Within normal limits.
 Adnexa:     No abnormality visualized.
Comments

 Ms. Badhon BPP was [DATE] today.  [facility]
 was informed, and have arranged for her to have an NST in
 the [facility].
Impression

 Single living intrauterine pregnancy at  29 weeks 1 day.
 Asymmetric interval fetal growth (EFW 28%, AC 4%).
 Normal amniotic fluid volume.
 Normal interval fetal anatomy.
 BPP [DATE] (-2 for lack of fetal breathing).
 Normal umbilical artery Dopplers.
Recommendations

 Recommend follow-up ultrasound examination in 3 weeks.

 questions or concerns.
                     Bibo, Sasekani

## 2016-07-22 ENCOUNTER — Emergency Department (HOSPITAL_COMMUNITY)
Admission: EM | Admit: 2016-07-22 | Discharge: 2016-07-23 | Disposition: A | Discharge status: Home / Self Care | Payer: Medicaid Other | Attending: Emergency Medicine | Admitting: Emergency Medicine

## 2016-07-22 ENCOUNTER — Encounter (HOSPITAL_COMMUNITY): Payer: Self-pay

## 2016-07-22 ENCOUNTER — Emergency Department (HOSPITAL_COMMUNITY): Payer: Medicaid Other

## 2016-07-22 DIAGNOSIS — Z87891 Personal history of nicotine dependence: Secondary | ICD-10-CM | POA: Insufficient documentation

## 2016-07-22 DIAGNOSIS — Y929 Unspecified place or not applicable: Secondary | ICD-10-CM | POA: Diagnosis not present

## 2016-07-22 DIAGNOSIS — S61411A Laceration without foreign body of right hand, initial encounter: Secondary | ICD-10-CM

## 2016-07-22 DIAGNOSIS — Y999 Unspecified external cause status: Secondary | ICD-10-CM | POA: Insufficient documentation

## 2016-07-22 DIAGNOSIS — W260XXA Contact with knife, initial encounter: Secondary | ICD-10-CM | POA: Diagnosis not present

## 2016-07-22 DIAGNOSIS — Z23 Encounter for immunization: Secondary | ICD-10-CM | POA: Insufficient documentation

## 2016-07-22 DIAGNOSIS — Y93G3 Activity, cooking and baking: Secondary | ICD-10-CM | POA: Insufficient documentation

## 2016-07-22 DIAGNOSIS — J45909 Unspecified asthma, uncomplicated: Secondary | ICD-10-CM | POA: Diagnosis not present

## 2016-07-22 MED ORDER — LIDOCAINE-EPINEPHRINE (PF) 2 %-1:200000 IJ SOLN
10.0000 mL | Freq: Once | INTRAMUSCULAR | Status: AC
  Administered 2016-07-22: 10 mL
  Filled 2016-07-22: qty 20

## 2016-07-22 MED ORDER — TETANUS-DIPHTH-ACELL PERTUSSIS 5-2.5-18.5 LF-MCG/0.5 IM SUSP
0.5000 mL | Freq: Once | INTRAMUSCULAR | Status: AC
  Administered 2016-07-22: 0.5 mL via INTRAMUSCULAR
  Filled 2016-07-22: qty 0.5

## 2016-07-22 NOTE — ED Notes (Signed)
Patient transported to X-ray 

## 2016-07-22 NOTE — ED Triage Notes (Signed)
Pt presents with lac across palm and three fingers of left hand; pt states she sliced hand by accident while cooking; pt c/o pain at 10/10 on arrival. Pt a&ox4 on arrival.

## 2016-07-22 NOTE — ED Provider Notes (Signed)
[facility]-EMERGENCY DEPT Provider Note   CSN: [id number] Arrival date & time: 07/22/16  2139  By signing my name below, I, Linna Darner, attest that this documentation has been prepared under the direction and in the presence of Sharen Heck, PA-C. Electronically Signed: Linna Darner, Scribe. 07/22/2016. 11:04 PM.  History   Chief Complaint Chief Complaint  Patient presents with  . Extremity Laceration    The history is provided by the patient. No language interpreter was used.     HPI Comments: Kim Kirby is a 25 y.o. female who presents to the Emergency Department complaining of a laceration to the ventral aspect of her right hand sustained shortly PTA. She states she was cutting meat at home and accidentally sliced her right hand with a kitchen knife. She reports pain secondary to the wound. Pt states the wound bled initially but she has achieved hemostasis by applying gauze. No alleviating factors noted and no medications tried PTA. No h/o DM. Tetanus status is not UTD. She denies numbness/tingling or any other associated symptoms.  Patient is right hand dominant. 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    Past Medical History:  Diagnosis Date  . Asthma   . Seasonal allergic rhinitis    spring  . Wears glasses     Patient Active Problem List   Diagnosis Date Noted  . Postpartum care and examination 06/05/2013  . Anemia 06/05/2013  . Intrauterine contraceptive device 06/05/2013    History reviewed. No pertinent surgical history.  OB History    Gravida Para Term Preterm AB Living   SAB TAB Ectopic Multiple Live Births           1       Home Medications    Prior to Admission medications   Medication Sig Start Date End Date Taking? Authorizing Provider  cephALEXin (KEFLEX) 500 MG capsule Take 1 capsule (500 mg total) by mouth 4 (four) times daily. 10/05/13   Teressa Lower, NP  ibuprofen (ADVIL,MOTRIN) 600 MG tablet Take 1 tablet (600 mg total) by mouth every 6  (six) hours. 04/25/13   Montez Morita, CNM  oxyCODONE-acetaminophen (PERCOCET/ROXICET) 5-325 MG per tablet Take 1-2 tablets by mouth every 4 (four) hours as needed for severe pain (moderate - severe pain). 04/25/13   Montez Morita, CNM  Pediatric Multiple Vit-C-FA (FLINSTONES GUMMIES OMEGA-3 DHA PO) Take 2 tablets by mouth daily.    Historical Provider, MD    Family History Family History  Problem Relation Age of Onset  . Hypertension Mother   . Other Father     unknown  . Cancer Maternal Aunt     lung  . Heart disease Neg Hx   . Diabetes Neg Hx   . Stroke Neg Hx     Social History Social History  Substance Use Topics  . Smoking status: Former Games developer  . Smokeless tobacco: Never Used  . Alcohol use No     Comment: sometimes     Allergies   Patient has no known allergies.   Review of Systems Review of Systems  Skin: Positive for wound.  Allergic/Immunologic: Negative for immunocompromised state.  Neurological: Negative for numbness.  Hematological: Does not bruise/bleed easily.   Physical Exam Updated Vital Signs BP 105/66 (BP Location: Left Arm)   Pulse 90   Temp 98.8 F (37.1 C) (Oral)   Resp 18   Ht  (1.575 m)   Wt 68 kg  LMP 06/27/2016 (Approximate)   SpO2 100%   BMI 27.44 kg/m   Physical Exam  Constitutional: She is oriented to person, place, and time. She appears well-developed and well-nourished. She is cooperative. No distress.  HENT:  Head: Normocephalic and atraumatic.  Nose: Nose normal.  Eyes: Conjunctivae and EOM are normal. No scleral icterus.  Cardiovascular: Normal rate, regular rhythm, normal heart sounds and intact distal pulses.   No murmur heard. Pulmonary/Chest: Effort normal and breath sounds normal. She has no wheezes.  Musculoskeletal: She exhibits tenderness and deformity (laceration).       Right hand: She exhibits decreased range of motion, tenderness and laceration. Normal sensation noted. Decreased strength noted. She  exhibits thumb/finger opposition.  Right hand: No flexion at 4th and 5th digit MCP, PIP and DIP Full extension at 4th and 5th digit MCP, PIP and DIP Patient amble to curl thumb, index and middle finger  Neurological: She is alert and oriented to person, place, and time.  Sensation to light touch in medial, ulnar and radial nerve distribution intact in right hand 5/5 strength with finger abduction against resistance in right hand 5/5 hand grip bilaterally Good pincer strength 2 point discrimination intact in 4th and 5th digit of left hand  Skin: Skin is warm and dry. Capillary refill takes less than 2 seconds. Laceration noted.  Right hand: Horizontal laceration to the ventral aspect of the fifth digit at level of MCP extending into the center of the palm. Patient did not tolerate full exam without analgesia.  Psychiatric: She has a normal mood and affect. Her behavior is normal. Judgment and thought content normal.  Nursing note and vitals reviewed.    ED Treatments / Results  Labs (all labs ordered are listed, but only abnormal results are displayed) Labs Reviewed - No data to display  EKG  EKG Interpretation None       Radiology Dg Hand Complete Right  Result Date: 07/22/2016 CLINICAL DATA:  Knife wound ulnar aspect of the hand. EXAM: RIGHT HAND - COMPLETE 3+ VIEW COMPARISON:  None. FINDINGS: Negative for acute fracture or dislocation. No radiopaque foreign body. No soft tissue gas. IMPRESSION: Negative. Electronically Signed   By: Ellery Plunk M.D.   On: 07/22/2016 23:51    Procedures Procedures (including critical care time)  DIAGNOSTIC STUDIES: Oxygen Saturation is 100% on RA, normal by my interpretation.    COORDINATION OF CARE: 11:10 PM Discussed treatment plan with pt at bedside and pt agreed to plan.  Medications Ordered in ED Medications  lidocaine-EPINEPHrine (XYLOCAINE W/EPI) 2 %-1:200000 (PF) injection 10 mL (10 mLs Infiltration Given 07/22/16 2320)    Tdap (BOOSTRIX) injection 0.5 mL (0.5 mLs Intramuscular Given 07/22/16 2320)     Initial Impression / Assessment and Plan / ED Course  I have reviewed the triage vital signs and the nursing notes.  Pertinent labs & imaging results that were available during my care of the patient were reviewed by me and considered in my medical decision making (see chart for details).  Clinical Course as of Jul 24 150  Sun Jul 22, 2016  2357 FINDINGS: Negative for acute fracture or dislocation. No radiopaque foreign body. No soft tissue gas.  IMPRESSION: Negative. DG Hand Complete Right [CG]    Clinical Course User Index [CG] Liberty Handy, PA-C   Right hand dominant female presents with accidental laceration to right hand.  Patient works at a [employer].  X-rays without bony injury, soft tissue gas or foreign bodies.  Laceration  was irrigated, anesthestized and explored in ED.  Lacerations not considered extensive however patient has no flexion at MCP, PIP or DIP of right 4th and 5th digits.  I did not visualize flexor tendons or tendon sheaths during exploration in ED, however loss of functionality suspicious for tendon injury.  Consulted hand surgery (Dr. Orlan Leavens) who advised loose sutures, splint and follow up in clinic with him later today at [facility] for re-evaluation.  No antibiotics. Tetanus updated in ED. Discussed plan with pt and answered questions. Pt is hemodynamically stable with no complaints prior to dc.    Final Clinical Impressions(s) / ED Diagnoses   Final diagnoses:  Laceration of right hand without foreign body, initial encounter    New Prescriptions Discharge Medication List as of 07/23/2016  1:23 AM     I personally performed the services described in this documentation, which was scribed in my presence. The recorded information has been reviewed and is accurate.    Liberty Handy, PA-C 07/23/16 0152    Samuel Jester, DO 07/24/16 2011

## 2016-07-22 NOTE — ED Notes (Signed)
Pt back from xray. No distress observed 

## 2016-07-23 ENCOUNTER — Encounter (HOSPITAL_COMMUNITY): Payer: Self-pay | Admitting: *Deleted

## 2016-07-23 NOTE — Discharge Instructions (Signed)
Please keep splint on until you are re-evaluated by hand surgeon later today.  Go to Dr. Bari Edward office today at [facility] for further evaluation and treatment.   You may take tylenol or ibuprofen for pain.

## 2016-07-23 NOTE — ED Notes (Signed)
PA at bedside.

## 2016-07-23 NOTE — ED Notes (Signed)
Ortho at bedside.

## 2016-07-24 ENCOUNTER — Encounter (HOSPITAL_COMMUNITY)
Admission: RE | Disposition: A | Discharge status: Home / Self Care | Payer: Self-pay | Source: Ambulatory Visit | Attending: Orthopedic Surgery

## 2016-07-24 ENCOUNTER — Ambulatory Visit (HOSPITAL_COMMUNITY)
Admission: RE | Admit: 2016-07-24 | Discharge: 2016-07-24 | Disposition: A | Discharge status: Home / Self Care | Payer: Medicaid Other | Source: Ambulatory Visit | Attending: Orthopedic Surgery | Admitting: Orthopedic Surgery

## 2016-07-24 ENCOUNTER — Ambulatory Visit (HOSPITAL_COMMUNITY): Payer: Medicaid Other | Admitting: Certified Registered Nurse Anesthetist

## 2016-07-24 ENCOUNTER — Other Ambulatory Visit (HOSPITAL_COMMUNITY): Payer: Self-pay | Admitting: Orthopedic Surgery

## 2016-07-24 ENCOUNTER — Encounter (HOSPITAL_COMMUNITY): Payer: Self-pay | Admitting: *Deleted

## 2016-07-24 ENCOUNTER — Other Ambulatory Visit: Payer: Self-pay | Admitting: Orthopedic Surgery

## 2016-07-24 DIAGNOSIS — Y93G3 Activity, cooking and baking: Secondary | ICD-10-CM | POA: Insufficient documentation

## 2016-07-24 DIAGNOSIS — S66126A Laceration of flexor muscle, fascia and tendon of right little finger at wrist and hand level, initial encounter: Secondary | ICD-10-CM | POA: Insufficient documentation

## 2016-07-24 DIAGNOSIS — S66821A Laceration of other specified muscles, fascia and tendons at wrist and hand level, right hand, initial encounter: Secondary | ICD-10-CM

## 2016-07-24 DIAGNOSIS — S66124A Laceration of flexor muscle, fascia and tendon of right ring finger at wrist and hand level, initial encounter: Secondary | ICD-10-CM | POA: Insufficient documentation

## 2016-07-24 DIAGNOSIS — S61214A Laceration without foreign body of right ring finger without damage to nail, initial encounter: Secondary | ICD-10-CM | POA: Diagnosis not present

## 2016-07-24 DIAGNOSIS — S61216A Laceration without foreign body of right little finger without damage to nail, initial encounter: Secondary | ICD-10-CM | POA: Diagnosis present

## 2016-07-24 DIAGNOSIS — W260XXA Contact with knife, initial encounter: Secondary | ICD-10-CM | POA: Insufficient documentation

## 2016-07-24 DIAGNOSIS — S61401A Unspecified open wound of right hand, initial encounter: Secondary | ICD-10-CM

## 2016-07-24 HISTORY — PX: WOUND EXPLORATION: SHX6188

## 2016-07-24 LAB — PREGNANCY, URINE: Preg Test, Ur: NEGATIVE

## 2016-07-24 SURGERY — WOUND EXPLORATION
Anesthesia: General | Laterality: Right

## 2016-07-24 MED ORDER — BUPIVACAINE HCL (PF) 0.25 % IJ SOLN
INTRAMUSCULAR | Status: DC | PRN
  Administered 2016-07-24: 30 mL

## 2016-07-24 MED ORDER — PHENYLEPHRINE 40 MCG/ML (10ML) SYRINGE FOR IV PUSH (FOR BLOOD PRESSURE SUPPORT)
PREFILLED_SYRINGE | INTRAVENOUS | Status: AC
  Filled 2016-07-24: qty 10

## 2016-07-24 MED ORDER — EPHEDRINE 5 MG/ML INJ
INTRAVENOUS | Status: AC
  Filled 2016-07-24: qty 10

## 2016-07-24 MED ORDER — DEXAMETHASONE SODIUM PHOSPHATE 10 MG/ML IJ SOLN
INTRAMUSCULAR | Status: AC
  Filled 2016-07-24: qty 1

## 2016-07-24 MED ORDER — LIDOCAINE 2% (20 MG/ML) 5 ML SYRINGE
INTRAMUSCULAR | Status: DC | PRN
  Administered 2016-07-24: 80 mg via INTRAVENOUS

## 2016-07-24 MED ORDER — PROPOFOL 10 MG/ML IV BOLUS
INTRAVENOUS | Status: DC | PRN
  Administered 2016-07-24: 200 mg via INTRAVENOUS

## 2016-07-24 MED ORDER — MIDAZOLAM HCL 2 MG/2ML IJ SOLN
INTRAMUSCULAR | Status: DC | PRN
  Administered 2016-07-24: 2 mg via INTRAVENOUS

## 2016-07-24 MED ORDER — DEXAMETHASONE SODIUM PHOSPHATE 10 MG/ML IJ SOLN
INTRAMUSCULAR | Status: DC | PRN
  Administered 2016-07-24: 10 mg via INTRAVENOUS

## 2016-07-24 MED ORDER — CEFAZOLIN SODIUM-DEXTROSE 2-4 GM/100ML-% IV SOLN
INTRAVENOUS | Status: AC
  Filled 2016-07-24: qty 100

## 2016-07-24 MED ORDER — CEFAZOLIN SODIUM-DEXTROSE 2-4 GM/100ML-% IV SOLN
2.0000 g | INTRAVENOUS | Status: AC
  Administered 2016-07-24: 2 g via INTRAVENOUS

## 2016-07-24 MED ORDER — LIDOCAINE 2% (20 MG/ML) 5 ML SYRINGE
INTRAMUSCULAR | Status: AC
  Filled 2016-07-24: qty 5

## 2016-07-24 MED ORDER — FENTANYL CITRATE (PF) 100 MCG/2ML IJ SOLN
INTRAMUSCULAR | Status: DC | PRN
  Administered 2016-07-24 (×2): 25 ug via INTRAVENOUS
  Administered 2016-07-24: 50 ug via INTRAVENOUS
  Administered 2016-07-24 (×2): 25 ug via INTRAVENOUS

## 2016-07-24 MED ORDER — PROMETHAZINE HCL 25 MG/ML IJ SOLN: 6.2500 mg | INTRAMUSCULAR | Status: DC | PRN

## 2016-07-24 MED ORDER — ONDANSETRON HCL 4 MG/2ML IJ SOLN
INTRAMUSCULAR | Status: AC
  Filled 2016-07-24: qty 2

## 2016-07-24 MED ORDER — FENTANYL CITRATE (PF) 100 MCG/2ML IJ SOLN
25.0000 ug | INTRAMUSCULAR | Status: DC | PRN
  Administered 2016-07-24 (×2): 50 ug via INTRAVENOUS

## 2016-07-24 MED ORDER — BUPIVACAINE HCL (PF) 0.5 % IJ SOLN
INTRAMUSCULAR | Status: AC
  Filled 2016-07-24: qty 30

## 2016-07-24 MED ORDER — SUCCINYLCHOLINE CHLORIDE 200 MG/10ML IV SOSY
PREFILLED_SYRINGE | INTRAVENOUS | Status: AC
Start: 2016-07-24 — End: 2016-07-24
  Filled 2016-07-24: qty 10

## 2016-07-24 MED ORDER — LACTATED RINGERS IV SOLN
INTRAVENOUS | Status: DC
  Administered 2016-07-24 (×2): via INTRAVENOUS

## 2016-07-24 MED ORDER — BUPIVACAINE HCL (PF) 0.25 % IJ SOLN
INTRAMUSCULAR | Status: AC
  Filled 2016-07-24: qty 30

## 2016-07-24 MED ORDER — CHLORHEXIDINE GLUCONATE 4 % EX LIQD: 60.0000 mL | Freq: Once | CUTANEOUS | Status: DC

## 2016-07-24 MED ORDER — FENTANYL CITRATE (PF) 250 MCG/5ML IJ SOLN
INTRAMUSCULAR | Status: AC
  Filled 2016-07-24: qty 5

## 2016-07-24 MED ORDER — PROPOFOL 10 MG/ML IV BOLUS
INTRAVENOUS | Status: AC
  Filled 2016-07-24: qty 20

## 2016-07-24 MED ORDER — MIDAZOLAM HCL 2 MG/2ML IJ SOLN
INTRAMUSCULAR | Status: AC
  Filled 2016-07-24: qty 2

## 2016-07-24 MED ORDER — FENTANYL CITRATE (PF) 100 MCG/2ML IJ SOLN
INTRAMUSCULAR | Status: AC
  Filled 2016-07-24: qty 2

## 2016-07-24 MED ORDER — ONDANSETRON HCL 4 MG/2ML IJ SOLN
INTRAMUSCULAR | Status: DC | PRN
  Administered 2016-07-24: 4 mg via INTRAVENOUS

## 2016-07-24 SURGICAL SUPPLY — 78 items
BANDAGE ACE 4X5 VEL STRL LF (GAUZE/BANDAGES/DRESSINGS) ×3 IMPLANT
BANDAGE ELASTIC 3 VELCRO ST LF (GAUZE/BANDAGES/DRESSINGS) ×3 IMPLANT
BANDAGE ELASTIC 4 VELCRO ST LF (GAUZE/BANDAGES/DRESSINGS) ×3 IMPLANT
BLADE SURG 15 STRL LF DISP TIS (BLADE) ×1 IMPLANT
BLADE SURG 15 STRL SS (BLADE) ×2
BNDG COHESIVE 1X5 TAN STRL LF (GAUZE/BANDAGES/DRESSINGS) IMPLANT
BNDG CONFORM 2 STRL LF (GAUZE/BANDAGES/DRESSINGS) ×3 IMPLANT
BNDG ESMARK 4X9 LF (GAUZE/BANDAGES/DRESSINGS) ×3 IMPLANT
BNDG GAUZE ELAST 4 BULKY (GAUZE/BANDAGES/DRESSINGS) ×3 IMPLANT
CORDS BIPOLAR (ELECTRODE) ×3 IMPLANT
COVER SURGICAL LIGHT HANDLE (MISCELLANEOUS) ×3 IMPLANT
CUFF TOURNIQUET SINGLE 18IN (TOURNIQUET CUFF) ×3 IMPLANT
CUFF TOURNIQUET SINGLE 24IN (TOURNIQUET CUFF) IMPLANT
DRAPE SURG 17X11 SM STRL (DRAPES) ×6 IMPLANT
DRAPE SURG 17X23 STRL (DRAPES) ×3 IMPLANT
DRSG ADAPTIC 3X8 NADH LF (GAUZE/BANDAGES/DRESSINGS) ×3 IMPLANT
DRSG EMULSION OIL 3X3 NADH (GAUZE/BANDAGES/DRESSINGS) ×3 IMPLANT
DRSG PAD ABDOMINAL 8X10 ST (GAUZE/BANDAGES/DRESSINGS) ×3 IMPLANT
ELECT REM PT RETURN 9FT ADLT (ELECTROSURGICAL) ×3
ELECTRODE REM PT RTRN 9FT ADLT (ELECTROSURGICAL) ×1 IMPLANT
GAUZE SPONGE 2X2 8PLY STRL LF (GAUZE/BANDAGES/DRESSINGS) IMPLANT
GAUZE SPONGE 4X4 12PLY STRL (GAUZE/BANDAGES/DRESSINGS) ×3 IMPLANT
GLOVE BIO SURGEON STRL SZ 6.5 (GLOVE) ×2 IMPLANT
GLOVE BIO SURGEONS STRL SZ 6.5 (GLOVE) ×1
GLOVE BIOGEL PI IND STRL 6.5 (GLOVE) ×1 IMPLANT
GLOVE BIOGEL PI IND STRL 7.0 (GLOVE) ×1 IMPLANT
GLOVE BIOGEL PI IND STRL 8.5 (GLOVE) ×1 IMPLANT
GLOVE BIOGEL PI INDICATOR 6.5 (GLOVE) ×2
GLOVE BIOGEL PI INDICATOR 7.0 (GLOVE) ×2
GLOVE BIOGEL PI INDICATOR 8.5 (GLOVE) ×2
GLOVE SURG ORTHO 8.0 STRL STRW (GLOVE) ×3 IMPLANT
GOWN STRL REUS W/ TWL LRG LVL3 (GOWN DISPOSABLE) ×2 IMPLANT
GOWN STRL REUS W/ TWL XL LVL3 (GOWN DISPOSABLE) ×1 IMPLANT
GOWN STRL REUS W/TWL LRG LVL3 (GOWN DISPOSABLE) ×4
GOWN STRL REUS W/TWL XL LVL3 (GOWN DISPOSABLE) ×2
IV NS IRRIG 3000ML ARTHROMATIC (IV SOLUTION) ×3 IMPLANT
KIT BASIN OR (CUSTOM PROCEDURE TRAY) ×3 IMPLANT
KIT ROOM TURNOVER OR (KITS) ×3 IMPLANT
MANIFOLD NEPTUNE II (INSTRUMENTS) ×3 IMPLANT
NEEDLE HYPO 25GX1X1/2 BEV (NEEDLE) IMPLANT
NS IRRIG 1000ML POUR BTL (IV SOLUTION) ×3 IMPLANT
PACK ORTHO EXTREMITY (CUSTOM PROCEDURE TRAY) ×3 IMPLANT
PAD ARMBOARD 7.5X6 YLW CONV (MISCELLANEOUS) ×6 IMPLANT
PAD CAST 3X4 CTTN HI CHSV (CAST SUPPLIES) ×1 IMPLANT
PAD CAST 4YDX4 CTTN HI CHSV (CAST SUPPLIES) ×1 IMPLANT
PADDING CAST COTTON 3X4 STRL (CAST SUPPLIES) ×2
PADDING CAST COTTON 4X4 STRL (CAST SUPPLIES) ×2
SET CYSTO W/LG BORE CLAMP LF (SET/KITS/TRAYS/PACK) ×3 IMPLANT
SOAP 2 % CHG 4 OZ (WOUND CARE) ×3 IMPLANT
SPECIMEN JAR SMALL (MISCELLANEOUS) ×3 IMPLANT
SPLINT FIBERGLASS 3X35 (CAST SUPPLIES) ×3 IMPLANT
SPLINT PLASTER CAST XFAST 3X15 (CAST SUPPLIES) ×1 IMPLANT
SPLINT PLASTER XTRA FASTSET 3X (CAST SUPPLIES) ×2
SPONGE GAUZE 2X2 STER 10/PKG (GAUZE/BANDAGES/DRESSINGS)
SUCTION FRAZIER HANDLE 10FR (MISCELLANEOUS)
SUCTION TUBE FRAZIER 10FR DISP (MISCELLANEOUS) IMPLANT
SUT ETHILON 3 0 PS 1 (SUTURE) ×3 IMPLANT
SUT FIBER WIRE 4.0 (SUTURE) ×27 IMPLANT
SUT FIBERWIRE 4-0 18 DIAM BLUE (SUTURE) ×9
SUT FIBERWIRE 4-0 18 TAPR NDL (SUTURE) ×27
SUT MERSILENE 4 0 P 3 (SUTURE) IMPLANT
SUT PROLENE 3 0 PS 2 (SUTURE) IMPLANT
SUT PROLENE 4 0 PS 2 18 (SUTURE) ×21 IMPLANT
SUT VIC AB 1 CT1 27 (SUTURE)
SUT VIC AB 1 CT1 27XBRD ANTBC (SUTURE) IMPLANT
SUT VIC AB 2-0 CT1 27 (SUTURE)
SUT VIC AB 2-0 CT1 27XBRD (SUTURE) IMPLANT
SUT VIC AB 2-0 CT1 TAPERPNT 27 (SUTURE) IMPLANT
SUTURE FIBERWR 4-0 18 DIA BLUE (SUTURE) ×3 IMPLANT
SUTURE FIBERWR 4-0 18 TAPR NDL (SUTURE) ×9 IMPLANT
SYR 20CC LL (SYRINGE) ×3 IMPLANT
SYR CONTROL 10ML LL (SYRINGE) ×3 IMPLANT
TOWEL OR 17X24 6PK STRL BLUE (TOWEL DISPOSABLE) ×3 IMPLANT
TOWEL OR 17X26 10 PK STRL BLUE (TOWEL DISPOSABLE) ×3 IMPLANT
TUBE CONNECTING 12'X1/4 (SUCTIONS)
TUBE CONNECTING 12X1/4 (SUCTIONS) IMPLANT
UNDERPAD 30X30 (UNDERPADS AND DIAPERS) ×3 IMPLANT
WATER STERILE IRR 1000ML POUR (IV SOLUTION) ×3 IMPLANT

## 2016-07-24 NOTE — Transfer of Care (Signed)
Immediate Anesthesia Transfer of Care Note  Patient: Kim Kirby  Procedure(s) Performed: Procedure(s): RIGHT RING AND SMALL FINGER WOUND EXPLORATIONS AND TENDON AND NERVE REPAIR AS INDICATED (Right)  Patient Location: PACU  Anesthesia Type:General  Level of Consciousness: awake, alert  and oriented  Airway & Oxygen Therapy: Patient Spontanous Breathing and Patient connected to nasal cannula oxygen  Post-op Assessment: Report given to RN, Post -op Vital signs reviewed and stable and Patient moving all extremities  Post vital signs: Reviewed and stable  Last Vitals:  Vitals:   07/24/16 1509 07/24/16 1828  BP: 114/69 133/84  Pulse: (!) 103 99  Resp: 18 18  Temp: 37.1 C 36.9 C    Last Pain:  Vitals:   07/24/16 1837  TempSrc:   PainSc: Asleep      Patients Stated Pain Goal: 3 (07/24/16 1509)  Complications: No apparent anesthesia complications

## 2016-07-24 NOTE — Op Note (Signed)
PREOPERATIVE DIAGNOSIS: Right small finger laceration 1.5 cm with tendon involvement Right ring finger laceration 2.5 cm with tendon involvement  POSTOPERATIVE DIAGNOSIS: Same  ATTENDING SURGEON: Dr. Gilman Schmidt he was scrubbed and present for the entire procedure  ASSISTANT SURGEON:Samantha Willaim Rayas was scrubbed and present for the entire procedure and necessary for the tendon repair and closure and splinting.  ANESTHESIA: Gen. via LMA  OPERATIVE PROCEDURE: 1. Repair of right ring finger flexor digitorum superficialis in zone 2 2. Repair right ring finger flexor digitorum profundus in zone 2 3. Traumatic laceration repair right ring finger 2-1/2 cm 4. Repair Right small finger flexor digitorum superficialis in zone 2 5. Repair Right small finger flexor digitorum profundus repair in zone 2 6. Traumatic laceration repair right small finger 1-1/2 cm  IMPLANTS: None  RADIOGRAPHIC INTERPRETATION: None  SURGICAL INDICATIONS: Kim Kirby is a right-hand-dominant female who is at home and sustained a sharp lacerations to the right ring and small fingers. Patient is seen and evaluated in the office and recommended undergo the above procedure. Risks benefits and alternatives discussed in detail with the patient and signed informed consent was obtained. But not limited to bleeding infection damage to nearby nerves arteries or tendons loss of motion of wrists and digits incomplete relief of symptoms and need for further surgical intervention  SURGICAL TECHNIQUE: Patient was properly identified in the preoperative holding area marked limp or unremarkable in the right ring and small fingers indicated correct operative sites. Patient brought back Or and placed supine on anesthesia and table general anesthesia was administered. Well-padded tourniquet was then placed on the right brachium and sit without 1000 drape. The right upper extremity was then prepped and draped in normal sterile fashion. A timeout  was called the correct site was then applied and the procedure then begun. Attention was then turned the right ring finger. Ring finger. Bruner incision was then made in the ring finger extended proximally and distally. The laceration was opened proximally and distally. Attention was then turned to identification of the neurovascular bundles both radially and ulnarly that were in continuity. The patient did have complete lacerations to the FDS and the FDP to the ring finger. Tendons were then identified proximally and distally. The tendons were then delivered through the pulley system preserving the A2 pulley. Using a modified Kessler 4 strand repair for each slip of the FDS these tendon slips were both repaired. Using a modified Kessler and horizontal mattress core suture 6 strand repair was then carried out to the FDP. These were reinforced with 2 more for a FiberWire sutures. All tendon was then repaired with 4-0 FiberWire suture. The wound was then thoroughly irrigated. The skin was then closed using simple Prolene sutures. This included the 2-1/2 cm traumatic laceration.  Attention was then turned to the right small finger were similar technique was used. The Bruner incision were then prepped and extended proximally distally. The foot identification of the neurovascular bundles were then carried out and was noted to be in continuity. Attention was then turned to the repair of the traumatic lacerations. Once this was carried out attention was turned to repair the FDP S. Both slips of the FDS were then repaired with the 4.0 FiberWire suture with a 4 strand modified Kessler suture. The FDP was then repaired with a 6 strand modified Kessler horizontal mattress suture. The wound was then thoroughly irrigated. After thorough wound irrigation the skin the traumatic laceration was an close the Prolene sutures. 8 mL %25 Marcaine infiltrated  between the 2 fingers. Adaptic dressing and a sterile compressive bandage  applied. The patient tolerated the procedure well was then placed in well-padded dorsal blocking splint accident taken recovery room in good condition.  POSTOPERATIVE PLAN: Procedure plan patient be discharged to home and seen back in the office in approximately 2 weeks and 2 weeks for wound check suture removal and begin a zone 2 FDP zone 2 flexor tendon repair protocol.

## 2016-07-24 NOTE — H&P (Signed)
Kim Kirby is an 26 y.o. female.   Chief Complaint: LACERATION OF THE RIGHT HAND WITHOUT FOREIGN BODY  HPI: THE PATIENT IS A 26 Y/O RIGHT HAND DOMINANT FEMALE WHO SUSTAINED A LACERATION TO THE RIGHT HAND ON 07/22/16 WHILE ATTEMPTING TO CUT APART FROZEN HAMBURGERS.  SHE WAS SEEN IN THE EMERGENCY DEPARTMENT WHERE THE LACERATION WAS CLEANED, SUTURED, AND BANDAGED.  SHE WAS SEEN IN THE OFFICE FOR FURTHER EVALUATION. DISCUSSED THE NATURE OF HER INJURY AND THAT SHE HAS LACERATED THE TENDONS AS WELL. DISCUSSED THE RATIONALE FOR SURGERY, INCLUDING THE RISKS VERSUS BENEFITS, AND THE POST-OPERATIVE RECOVERY PROCESS.  THE PATIENT IS HERE TODAY FOR SURGERY.   Past Medical History:  Diagnosis Date  . Asthma    as a child  . Seasonal allergic rhinitis    spring  . Wears glasses     No past surgical history on file.  Family History  Problem Relation Age of Onset  . Hypertension Mother   . Other Father     unknown  . Cancer Maternal Aunt     lung  . Heart disease Neg Hx   . Diabetes Neg Hx   . Stroke Neg Hx    Social History:  reports that she has quit smoking. She has never used smokeless tobacco. She reports that she does not drink alcohol or use drugs.  Allergies: No Known Allergies  No prescriptions prior to admission.    No results found for this or any previous visit (from the past 48 hour(s)). Dg Hand Complete Right  Result Date: 07/22/2016 CLINICAL DATA:  Knife wound ulnar aspect of the hand. EXAM: RIGHT HAND - COMPLETE 3+ VIEW COMPARISON:  None. FINDINGS: Negative for acute fracture or dislocation. No radiopaque foreign body. No soft tissue gas. IMPRESSION: Negative. Electronically Signed   By: Ellery Plunk M.D.   On: 07/22/2016 23:51    ROS NO RECENT ILLNESSES OR HOSPITALIZATIONS  Last menstrual period 06/27/2016. Physical Exam  General Appearance:  Alert, cooperative, no distress, appears stated age  Head:  Normocephalic, without obvious abnormality, atraumatic   Eyes:  Pupils equal, conjunctiva/corneas clear,         Throat: Lips, mucosa, and tongue normal; teeth and gums normal  Neck: No visible masses     Lungs:   respirations unlabored  Chest Wall:  No tenderness or deformity  Heart:  Regular rate and rhythm,  Abdomen:   Soft, non-tender,         Extremities: RUE: LACERATION ON THE VOLAR ASPECT OF THE HAND WITH INJURY TO THE ZONE 2 FLEXOR TENDONS. SUTURES INTACT. NO ERYTHEMA, ECCHYMOSIS, OR PURULENT DRAINAGE. CAPILLARY REFILL LESS THAN 2 SECONDS. PULSES 2+. SENSATION INTACT TO LIGHT TOUCH. FULL RANGE OF MOTION OF THE THUMB, INDEX, AND LONG FINGER. UNABLE TO MOVE THE RING AND SMALL FINGERS.   Pulses: 2+ and symmetric  Skin: Skin color, texture, turgor normal, no rashes or lesions     Neurologic: Normal    Assessment LACERATION OF THE RIGHT HAND WITHOUT FOREIGN BODY  Plan RIGHT RING AND SMALL FINGER WOUND EXPLORATIONS WITH TENDON AND NERVE REPAIR AS INDICATED; REPAIR OF FLEXOR TENDON IN ZONE 2 WITHOUT GRAFT  R/B/A DISCUSSED WITH PT IN OFFICE.  PT VOICED UNDERSTANDING OF PLAN CONSENT SIGNED DAY OF SURGERY PT SEEN AND EXAMINED PRIOR TO OPERATIVE PROCEDURE/DAY OF SURGERY SITE MARKED. QUESTIONS ANSWERED WILL GO HOME FOLLOWING SURGERY  WE ARE PLANNING SURGERY FOR YOUR UPPER EXTREMITY. THE RISKS AND BENEFITS OF SURGERY INCLUDE BUT NOT LIMITED TO BLEEDING INFECTION,  DAMAGE TO NEARBY NERVES ARTERIES TENDONS, FAILURE OF SURGERY TO ACCOMPLISH ITS INTENDED GOALS, PERSISTENT SYMPTOMS AND NEED FOR FURTHER SURGICAL INTERVENTION. WITH THIS IN MIND WE WILL PROCEED. I HAVE DISCUSSED WITH THE PATIENT THE PRE AND POSTOPERATIVE REGIMEN AND THE DOS AND DON'TS. PT VOICED UNDERSTANDING AND INFORMED CONSENT SIGNED.  [name] 07/24/2016, 7:36 AM

## 2016-07-24 NOTE — Anesthesia Procedure Notes (Signed)
Procedure Name: LMA Insertion Date/Time: 07/24/2016 4:16 PM Performed by: Orvilla Fus A Pre-anesthesia Checklist: Patient identified, Emergency Drugs available, Suction available and Patient being monitored Patient Re-evaluated:Patient Re-evaluated prior to inductionOxygen Delivery Method: Circle System Utilized Preoxygenation: Pre-oxygenation with 100% oxygen Intubation Type: IV induction Ventilation: Mask ventilation without difficulty LMA: LMA inserted LMA Size: 4.0 Number of attempts: 1 Airway Equipment and Method: Bite block Placement Confirmation: positive ETCO2 Tube secured with: Tape Dental Injury: Teeth and Oropharynx as per pre-operative assessment

## 2016-07-24 NOTE — Anesthesia Preprocedure Evaluation (Addendum)
Anesthesia Evaluation  Patient identified by MRN, date of birth, ID band Patient awake    Reviewed: Allergy & Precautions, NPO status , Patient's Chart, lab work & pertinent test results  Airway Mallampati: II  TM Distance: >3 FB Neck ROM: Full    Dental  (+) Teeth Intact, Dental Advisory Given   Pulmonary asthma , Current Smoker,    Pulmonary exam normal breath sounds clear to auscultation       Cardiovascular Exercise Tolerance: Good negative cardio ROS Normal cardiovascular exam Rhythm:Regular Rate:Normal     Neuro/Psych negative neurological ROS  negative psych ROS   GI/Hepatic negative GI ROS, Neg liver ROS,   Endo/Other  negative endocrine ROS  Renal/GU negative Renal ROS     Musculoskeletal negative musculoskeletal ROS (+)   Abdominal   Peds  Hematology  (+) Blood dyscrasia, anemia ,   Anesthesia Other Findings Day of surgery medications reviewed with the patient.  Reproductive/Obstetrics negative OB ROS                           Anesthesia Physical Anesthesia Plan  ASA: II  Anesthesia Plan: General   Post-op Pain Management:    Induction: Intravenous  Airway Management Planned: LMA  Additional Equipment:   Intra-op Plan:   Post-operative Plan: Extubation in OR  Informed Consent: I have reviewed the patients History and Physical, chart, labs and discussed the procedure including the risks, benefits and alternatives for the proposed anesthesia with the patient or authorized representative who has indicated his/her understanding and acceptance.   Dental advisory given  Plan Discussed with: CRNA  Anesthesia Plan Comments: (Risks/benefits of general anesthesia discussed with patient including risk of damage to teeth, lips, gum, and tongue, nausea/vomiting, allergic reactions to medications, and the possibility of heart attack, stroke and death.  All patient questions  answered.  Patient wishes to proceed.)       Anesthesia Quick Evaluation

## 2016-07-24 NOTE — Discharge Instructions (Signed)
KEEP BANDAGE CLEAN AND DRY CALL OFFICE FOR F/U APPT [phone] in 10 days RX sent to [address] KEEP HAND ELEVATED ABOVE HEART OK TO APPLY ICE TO OPERATIVE AREA CONTACT OFFICE IF ANY WORSENING PAIN OR CONCERNS.

## 2016-07-25 ENCOUNTER — Encounter (HOSPITAL_COMMUNITY): Payer: Self-pay | Admitting: Orthopedic Surgery

## 2016-07-25 NOTE — Anesthesia Postprocedure Evaluation (Signed)
Anesthesia Post Note  Patient: Kim Kirby  Procedure(s) Performed: Procedure(s) (LRB): RIGHT RING AND SMALL FINGER WOUND EXPLORATIONS AND TENDON AND NERVE REPAIR AS INDICATED (Right)  Patient location during evaluation: PACU Anesthesia Type: General Level of consciousness: awake and alert Pain management: pain level controlled Vital Signs Assessment: post-procedure vital signs reviewed and stable Respiratory status: spontaneous breathing, nonlabored ventilation, respiratory function stable and patient connected to nasal cannula oxygen Cardiovascular status: blood pressure returned to baseline and stable Postop Assessment: no signs of nausea or vomiting Anesthetic complications: no       Last Vitals:  Vitals:   07/24/16 1843 07/24/16 1853  BP: 115/77 119/81  Pulse: 85 87  Resp: 14 16  Temp:  36.9 C    Last Pain:  Vitals:   07/24/16 1853  TempSrc:   PainSc: 3                  Cecile Hearing

## 2016-08-16 ENCOUNTER — Ambulatory Visit: Payer: Medicaid Other | Attending: Orthopedic Surgery | Admitting: Occupational Therapy

## 2016-08-16 DIAGNOSIS — M25641 Stiffness of right hand, not elsewhere classified: Secondary | ICD-10-CM

## 2016-08-16 DIAGNOSIS — R278 Other lack of coordination: Secondary | ICD-10-CM | POA: Diagnosis present

## 2016-08-16 DIAGNOSIS — M25541 Pain in joints of right hand: Secondary | ICD-10-CM | POA: Diagnosis present

## 2016-08-16 DIAGNOSIS — M6281 Muscle weakness (generalized): Secondary | ICD-10-CM

## 2016-08-16 NOTE — Patient Instructions (Signed)
   WEARING SCHEDULE:  Wear splint at ALL times except for hygiene care  PURPOSE:  To prevent movement and for protection until injury can heal  CARE OF SPLINT:  Keep splint away from heat sources including: stove, radiator or furnace, or a car in sunlight. The splint can melt and will no longer fit you properly  Keep away from pets and children  Clean the splint with rubbing alcohol 1-2 times per day.  * During this time, make sure you also clean your hand/arm as instructed by your therapist and/or perform dressing changes as needed. Then dry hand/arm completely before replacing splint. (When cleaning hand/arm, keep it immobilized in same position until splint is replaced)  PRECAUTIONS/POTENTIAL PROBLEMS: *If you notice or experience increased pain, swelling, numbness, or a lingering reddened area from the splint: Contact your therapist immediately by calling [phone]. You must wear the splint for protection, but we will get you scheduled for adjustments as quickly as possible.  (If only straps or hooks need to be replaced and NO adjustments to the splint need to be made, just call the office ahead and let them know you are coming in)  If you have any medical concerns or signs of infection, please call your doctor immediately   Scar massage:   Use vitamin E cream/oil or cocoa butter and rub deep circles along incision for 5 minutes, 2x/day. BE SURE TO BLOCK FINGERS AT BIG KNUCKLES TO PREVENT STRAIGHTENING WHEN DOING   EXERCISES:   Do exercises (issued separately) 6-8x/day, every 1.5 - 2 hours during waking hours with splint ON! These are passive which requires other hand  Starting Monday 08/20/16:  In addition to passive ex's (already issued), begin active motion (making gentle fist and opening) with splint ON!!

## 2016-08-16 NOTE — Therapy (Signed)
[facility] [address] [place], [place], [address] Phone: [phone]   Fax:  [phone]  Occupational Therapy Evaluation  Patient Details  Name: Kim Kirby MRN: [id number] Date of Birth: 11/07/1990 Referring Provider: Dr. Iran Planas  Encounter Date: 08/16/2016      OT End of Session - 08/16/16 1641    Visit Number 1   Number of Visits 4   Authorization Type MCD - awaiting authorization   OT Start Time 1450   OT Stop Time 1625   OT Time Calculation (min) 95 min   Activity Tolerance Patient tolerated treatment well      Past Medical History:  Diagnosis Date  . Asthma    as a child  . Seasonal allergic rhinitis    spring  . Wears glasses     Past Surgical History:  Procedure Laterality Date  . NO PAST SURGERIES    . WOUND EXPLORATION Right 07/24/2016   Procedure: RIGHT RING AND SMALL FINGER WOUND EXPLORATIONS AND TENDON AND NERVE REPAIR AS INDICATED;  Surgeon: Iran Planas, MD;  Location: [facility];  Service: Orthopedics;  Laterality: Right;    There were no vitals filed for this visit.      Subjective Assessment - 08/16/16 1456    Patient is accompained by: Family member  mom   Pertinent History Rt ring and small finger zone 2 flexor tendon repair 07/24/16   Patient Stated Goals to get my hand better   Currently in Pain? Yes   Pain Score 6    Pain Location Hand   Pain Orientation Right   Pain Descriptors / Indicators Pressure   Pain Type Acute pain;Surgical pain   Pain Onset 1 to 4 weeks ago   Pain Frequency Constant   Aggravating Factors  pressure of the cast           [facility] OT Assessment - 08/16/16 0001      Assessment   Diagnosis Rt ring and small finger zone II flexor tendon repair   Referring Provider Dr. Iran Planas   Onset Date 07/24/16  is surgery date (original injury 07/22/16)   Assessment Pt arrived fully wrapped/protected in post surgical cast   Prior Therapy none     Precautions   Precautions Other (comment)   Precaution Comments Per flexor tendon repair protocol for zone II (Modified Duran)   Required Braces or Orthoses Other Brace/Splint   Other Brace/Splint Dorsal Block Splint (DBS)      Home  Environment   Additional Comments Pt currently staying with her mom for assistance in ADLS/childcare   Lives With Alone  with 3 y.o. 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 Pt educated in precautions, hygience care, and scar massage  Exercises   Exercises Hand     Hand Exercises   Other Hand Exercises Pt issued initial HEP within confines of splint (per protocol) including passive flexion and extension of fingers. Pt instructed to begin A/ROM within confines of splint 08/20/16 (per protocol). Pt verbalized understanding and agrees     Splinting   Splinting Fabricated and fitted DBS per protocol and issued splint. Pt practiced donning/doffing properly. Reviewed wear and care with patient               OT Education - 08/16/16 1615    Education provided Yes   Education Details hygiene care, splint wear and care, precautions, initial HEP, scar massage   Person(s) Educated Patient   Methods Explanation;Demonstration;Handout   Comprehension Verbalized understanding;Returned demonstration          OT Short Term Goals - 08/16/16 1647      OT SHORT  TERM GOAL #1   Title Independent with splint wear and care    Baseline issued but may need adjustments   Time 4   Period Weeks   Status New     OT SHORT TERM GOAL #2   Title Independent with initial HEP    Baseline issued, but needs updates per protocol   Time 4   Period Weeks   Status New     OT SHORT TERM GOAL #3   Title Independent with scar massage   Baseline issued at eval, may need review   Status Partially Met           OT Long Term Goals - 08/16/16 1648      OT LONG TERM GOAL #1   Title Independent with strengthening HEP    Baseline Dependent d/t current precautions   Time 8   Period Weeks   Status New     OT LONG TERM GOAL #2   Title Pt to return to using Rt hand as dominant hand for BADLS including writing   Baseline dependent d/t current precautions   Time 8   Period Weeks   Status New     OT LONG TERM GOAL #3   Title Grip strength Rt hand to be 30 lbs or greater to open jars/containers   Baseline dependent d/t current precautions   Time 8   Period Weeks   Status New     OT LONG TERM GOAL #4   Title Pt to have 90% or greater composite flexion/extension Rt hand   Baseline unable to assess A/ROM d/t current precautions   Time 8   Period Weeks   Status New               Plan - 08/16/16 1642    Clinical Impression Statement Pt is a 26 y.o. female who presents to outpatient rehab s/p right ring and small finger flexor tendon repair, [phone] (zone II) on 07/24/16. Pt is now 3 weeks post-op and MD has removed stitches. Pt arrived fully wrapped and protected and seen today for initial evaluation, splint fabrication, and education    Rehab Potential Good   OT Frequency --  3 visits over 8 week duration (MCD)   OT Treatment/Interventions Self-care/ADL training;Moist Heat;Fluidtherapy;DME and/or AE instruction;Splinting;Patient/family education;Contrast Bath;Compression bandaging;Therapeutic exercises;Therapeutic activities;Scar  mobilization;Ultrasound;Cryotherapy;Passive range of motion;Parrafin;Electrical Stimulation;Manual Therapy   Plan Check on MCD authorization, begin 4 1/2 week protocol on 08/24/16 or after.    Consulted and Agree with Plan of Care Patient      Patient will benefit from skilled therapeutic intervention  in order to improve the following deficits and impairments:  Decreased coordination, Increased edema, Impaired sensation, Decreased knowledge of precautions, Decreased skin integrity, Impaired UE functional use, Pain, Decreased strength  Visit Diagnosis: Pain in joint of right hand - Plan: Ot plan of care cert/re-cert  Stiffness of right hand, not elsewhere classified - Plan: Ot plan of care cert/re-cert  Muscle weakness (generalized) - Plan: Ot plan of care cert/re-cert  Other lack of coordination - Plan: Ot plan of care cert/re-cert    Problem List Patient Active Problem List   Diagnosis Date Noted  . Postpartum care and examination 06/05/2013  . Anemia 06/05/2013  . Intrauterine contraceptive device 06/05/2013    Carey Bullocks, OTR/L 08/16/2016, 4:53 PM  [facility] [address] [place], [place], [address] Phone: [phone]   Fax:  [phone]  Name: Kim Kirby MRN: [id number] Date of Birth: 1990-07-02

## 2016-08-24 ENCOUNTER — Ambulatory Visit: Payer: Medicaid Other | Attending: Orthopedic Surgery | Admitting: Occupational Therapy

## 2016-08-24 ENCOUNTER — Telehealth: Payer: Self-pay | Admitting: Occupational Therapy

## 2016-08-24 DIAGNOSIS — R278 Other lack of coordination: Secondary | ICD-10-CM | POA: Insufficient documentation

## 2016-08-24 DIAGNOSIS — M6281 Muscle weakness (generalized): Secondary | ICD-10-CM | POA: Insufficient documentation

## 2016-08-24 DIAGNOSIS — M25541 Pain in joints of right hand: Secondary | ICD-10-CM | POA: Insufficient documentation

## 2016-08-24 DIAGNOSIS — M25641 Stiffness of right hand, not elsewhere classified: Secondary | ICD-10-CM | POA: Insufficient documentation

## 2016-08-24 NOTE — Telephone Encounter (Signed)
Message left for patient that she had missed her 2:45 Friday appt and that it was important that she call to reschedule next week.Therapist left  facility  phone number.

## 2016-09-04 ENCOUNTER — Ambulatory Visit: Payer: Medicaid Other | Admitting: Occupational Therapy

## 2016-09-05 ENCOUNTER — Ambulatory Visit: Payer: Medicaid Other | Admitting: *Deleted

## 2016-09-05 ENCOUNTER — Encounter: Payer: Self-pay | Admitting: *Deleted

## 2016-09-05 DIAGNOSIS — M25541 Pain in joints of right hand: Secondary | ICD-10-CM | POA: Diagnosis present

## 2016-09-05 DIAGNOSIS — M6281 Muscle weakness (generalized): Secondary | ICD-10-CM

## 2016-09-05 DIAGNOSIS — M25641 Stiffness of right hand, not elsewhere classified: Secondary | ICD-10-CM

## 2016-09-05 DIAGNOSIS — R278 Other lack of coordination: Secondary | ICD-10-CM | POA: Diagnosis present

## 2016-09-05 NOTE — Therapy (Signed)
[facility] [address] [place], [place], [address] Phone: [phone]   Fax:  [phone]  Occupational Therapy Treatment  Patient Details  Name: Kim Kirby MRN: [id number] Date of Birth: 1990/06/19 Referring Provider: Dr. Iran Planas  Encounter Date: 09/05/2016      OT End of Session - 09/05/16 1051    Visit Number 2   Number of Visits 4   Authorization Type MCD - authorization for 3 visits from 08/22/16-10/16/16   Authorization - Visit Number 1   Authorization - Number of Visits 3   OT Start Time 7680   OT Stop Time 1034   OT Time Calculation (min) 47 min   Activity Tolerance Patient tolerated treatment well;No increased pain   Behavior During Therapy WFL for tasks assessed/performed      Past Medical History:  Diagnosis Date  . Asthma    as a child  . Seasonal allergic rhinitis    spring  . Wears glasses     Past Surgical History:  Procedure Laterality Date  . NO PAST SURGERIES    . WOUND EXPLORATION Right 07/24/2016   Procedure: RIGHT RING AND SMALL FINGER WOUND EXPLORATIONS AND TENDON AND NERVE REPAIR AS INDICATED;  Surgeon: Iran Planas, MD;  Location: [facility];  Service: Orthopedics;  Laterality: Right;    There were no vitals filed for this visit.      Subjective Assessment - 09/05/16 0950    Subjective  "I'm going to be honest, I haven't done the exercises b/c I have been scared to do it"    Patient is accompained by: Family member  Daughter 3 y/o   Pertinent History Rt ring and small finger zone 2 flexor tendon repair 07/24/16.   Pt n/s 08/24/16 and cx 09/04/16, arrived late on 09/05/16   Patient Stated Goals to get my hand better   Currently in Pain? No/denies   Pain Score 0-No pain   Multiple Pain Sites No                      OT Treatments/Exercises (OP) - 09/05/16 0001      ADLs   ADL Comments Carefully removed protective splint in clinic today, cleaned hand/forearm and  incisions, dryed area and applied stockinette while keeping hand in protective position. Reviewed/educated in precautions, hygience care, and scar massage     Exercises   Exercises Hand     Hand Exercises   Other Hand Exercises Pt reports not performing HEP as she is nervous to perform. She is with noted IP stiffness and is not able to completely perform HEP within confines of splint this date. Stressed importance of performing HEP within confines of splint  hourly while awake at this time (per protocol) including passive flexion and extension of fingers. A/ROM within confines of splint  (per protocol). Pt verbalized understanding and agrees   Other Hand Exercises Upgraded HEP today to include removing dorsal blocking splint hourly and actively flexing fingers and wrist, followed by stratghtening fingers and wrist to neutral (per 4 1/2 week protocol), as well as actively making a fist and straigthening her fingers (per protocol) and active composite fist, hook fist and finger extension (again, per protocol at 4 1/2 weeks). Stressed importance of performing all exercises as pt reports that she has not performed them at home due to "being afraid". Pt is with noted IP stiffness at this time.     Splinting   Splinting Splint check - no  changes at this time. Pt will remove splint for HEP and will then re-apply. Straps were replaced secondary to wear and tear today. Pt will wear splint in-between exercise sessions.                 OT Education - 09/05/16 1048    Education provided Yes   Education Details Hygiene care, splint use, care and precautions, scar management. Reviewed and upgraded HEP (as per protocol at 4 1/2 weeks post-op even though pt is now 6 weeks and 1 day post-op. She has had multiple cancelations/no-shows). Stressed importance of HEP hourly while awake and need for f/u appt next week to progress program.   Person(s) Educated Patient   Methods Explanation;Demonstration;Tactile  cues;Verbal cues;Handout   Comprehension Verbalized understanding;Returned demonstration          OT Short Term Goals - 09/05/16 1058      OT SHORT TERM GOAL #1   Title Independent with splint wear and care    Baseline issued but may need adjustments   Time 4   Period Weeks   Status Achieved     OT SHORT TERM GOAL #2   Title Independent with initial HEP    Baseline issued, but needs updates per protocol   Time 4   Period Weeks   Status On-going     OT SHORT TERM GOAL #3   Title Independent with scar massage   Baseline issued at eval, may need review   Time 4   Period Weeks   Status Partially Met           OT Long Term Goals - 08/16/16 1648      OT LONG TERM GOAL #1   Title Independent with strengthening HEP    Baseline Dependent d/t current precautions   Time 8   Period Weeks   Status New     OT LONG TERM GOAL #2   Title Pt to return to using Rt hand as dominant hand for BADLS including writing   Baseline dependent d/t current precautions   Time 8   Period Weeks   Status New     OT LONG TERM GOAL #3   Title Grip strength Rt hand to be 30 lbs or greater to open jars/containers   Baseline dependent d/t current precautions   Time 8   Period Weeks   Status New     OT LONG TERM GOAL #4   Title Pt to have 90% or greater composite flexion/extension Rt hand   Baseline unable to assess A/ROM d/t current precautions   Time 8   Period Weeks   Status New               Plan - 09/05/16 1051    Clinical Impression Statement Pt reports that she has not been performing HEP as issued, per flexor tendon/nerve protocol (Modified Duran), due to fear of re-injury. Pt presents with moderate edema R hand/digits as well as joint stiffness RF/Small and MF. Pt HEP was upgraded to 4 1/2 week post-op protocol, b/c she has missed or canceled several appointments. Pt is currently 6 weeks and 1 day post-op and should benfeit from hourly performance of HEP at this time to  encourage tendon excursion and avoid joint contractures in the future. Straps were replaced on splint and her forearm/incision was cleaned and redressed with stockinette.    OT Frequency Other (comment)  3 visits over 8 week duration   OT Treatment/Interventions Self-care/ADL training;Moist Heat;Fluidtherapy;DME and/or AE  instruction;Splinting;Patient/family education;Contrast Bath;Compression bandaging;Therapeutic exercises;Therapeutic activities;Scar mobilization;Ultrasound;Cryotherapy;Passive range of motion;Parrafin;Electrical Stimulation;Manual Therapy   Plan Begin 6 week post-op protocol next week. Edema control and scar management.   Consulted and Agree with Plan of Care Patient      Patient will benefit from skilled therapeutic intervention in order to improve the following deficits and impairments:  Decreased coordination, Increased edema, Impaired sensation, Decreased knowledge of precautions, Decreased skin integrity, Impaired UE functional use, Pain, Decreased strength  Visit Diagnosis: Stiffness of right hand, not elsewhere classified  Muscle weakness (generalized)  Other lack of coordination  Pain in joint of right hand    Problem List Patient Active Problem List   Diagnosis Date Noted  . Postpartum care and examination 06/05/2013  . Anemia 06/05/2013  . Intrauterine contraceptive device 06/05/2013    Carlynn Herald, Jazz Biddy Beth Dixon, OTR/L 09/05/2016, 11:00 AM  [facility] [address] [place], [place], [address] Phone: [phone]   Fax:  [phone]  Name: Kim Kirby MRN: [id number] Date of Birth: [birth year]

## 2016-09-12 ENCOUNTER — Ambulatory Visit: Payer: Medicaid Other | Admitting: Occupational Therapy

## 2016-09-12 DIAGNOSIS — M25541 Pain in joints of right hand: Secondary | ICD-10-CM

## 2016-09-12 DIAGNOSIS — M6281 Muscle weakness (generalized): Secondary | ICD-10-CM

## 2016-09-12 DIAGNOSIS — R278 Other lack of coordination: Secondary | ICD-10-CM

## 2016-09-12 DIAGNOSIS — M25641 Stiffness of right hand, not elsewhere classified: Secondary | ICD-10-CM | POA: Diagnosis not present

## 2016-09-12 NOTE — Patient Instructions (Signed)
Continue to perform the 5 1/2 week post op exercises issued last time. Make a fist with your right hand then extend fingers and wrist- assist gently with your other hand perform 20 reps every 2 hrs, Perform place and hol exercises for right hand 20 reps every 2 hrs. Perform the prayer position as able 10 reps every 2 hrs with right hand.

## 2016-09-12 NOTE — Therapy (Signed)
[facility] [address] [place], [place], [address] Phone: [phone]   Fax:  [phone]  Occupational Therapy Treatment  Patient Details  Name: Keydi Giel MRN: [id number] Date of Birth: Apr 25, 1990 Referring Provider: Dr. Iran Planas  Encounter Date: 09/12/2016      OT End of Session - 09/12/16 1650    Visit Number 3   Number of Visits 4   Authorization Type MCD - authorization for 3 visits from 08/22/16-10/16/16   Authorization - Visit Number 2   Authorization - Number of Visits 3   OT Start Time 1500  pt late   OT Stop Time 1535   OT Time Calculation (min) 35 min   Activity Tolerance Patient tolerated treatment well   Behavior During Therapy Anxious      Past Medical History:  Diagnosis Date  . Asthma    as a child  . Seasonal allergic rhinitis    spring  . Wears glasses     Past Surgical History:  Procedure Laterality Date  . NO PAST SURGERIES    . WOUND EXPLORATION Right 07/24/2016   Procedure: RIGHT RING AND SMALL FINGER WOUND EXPLORATIONS AND TENDON AND NERVE REPAIR AS INDICATED;  Surgeon: Iran Planas, MD;  Location: [facility];  Service: Orthopedics;  Laterality: Right;    There were no vitals filed for this visit.      Subjective Assessment - 09/12/16 1654    Subjective  Pt reports she may not have been exercising as much as she should   Pertinent History Rt ring and small finger zone 2 flexor tendon repair 07/24/16.    Patient Stated Goals to get my hand better   Currently in Pain? Yes   Pain Score 3    Pain Location Hand   Pain Orientation Right   Pain Type Acute pain   Pain Onset 1 to 4 weeks ago   Pain Frequency Constant   Aggravating Factors  exercise   Pain Relieving Factors rest   Effect of Pain on Daily Activities limits a/ROM           Treatment: Hand was washed with soap and water at the sink and dried thoroughly. Pt has a significant amount of dead skin present and pt  was instructed to wash gently and dry thoroughly. Pt has maceration between her fingers. Therapist reinforced importance of keeping hand dry and drying thoroughly. Reinforced previously issued A/ROM exercises and educated pt in new P/ROM to digits and wrist. Pt demonstrates limitations in A/ROM flexion  And extension for digits 4, 5. Place and hold exercises for composite flexion. Therapist reinforced with pt that she  Needs to perform exercises every 2 hours in order to se improvements in A/ROM. Therapsit reinforced that pt is not cleared for strengthening and that she should not pick up anything heavy with her right hand.                    OT Education - 09/12/16 1642    Education provided Yes   Education Details hand hygiene, d/c splint, reviewed 5 1/2 week exercises from modified Duran protocol, insturcted pt in gentle passive finger and wrist ext, reinforced no heavy use or lifting at this time.   Person(s) Educated Patient   Methods Explanation;Demonstration;Verbal cues;Handout   Comprehension Verbalized understanding;Returned demonstration          OT Short Term Goals - 09/12/16 1651      OT SHORT TERM GOAL #1  Title Independent with splint wear and care    Baseline issued but may need adjustments   Time 4   Period Weeks   Status Achieved     OT SHORT TERM GOAL #2   Title Independent with initial HEP    Baseline issued, but needs updates per protocol   Time 4   Period Weeks   Status Achieved     OT SHORT TERM GOAL #3   Title Independent with scar massage   Baseline issued at eval, may need review   Time 4   Period Weeks   Status Partially Met           OT Long Term Goals - 08/16/16 1648      OT LONG TERM GOAL #1   Title Independent with strengthening HEP    Baseline Dependent d/t current precautions   Time 8   Period Weeks   Status New     OT LONG TERM GOAL #2   Title Pt to return to using Rt hand as dominant hand for BADLS including  writing   Baseline dependent d/t current precautions   Time 8   Period Weeks   Status New     OT LONG TERM GOAL #3   Title Grip strength Rt hand to be 30 lbs or greater to open jars/containers   Baseline dependent d/t current precautions   Time 8   Period Weeks   Status New     OT LONG TERM GOAL #4   Title Pt to have 90% or greater composite flexion/extension Rt hand   Baseline unable to assess A/ROM d/t current precautions   Time 8   Period Weeks   Status New               Plan - 09/12/16 1651    Clinical Impression Statement Pt is progressing slowly limited by pain and stiffness. Therapsit d/c splint and intiated gentle P/ROM extension to digits and wirist.   Rehab Potential Fair   OT Frequency --  3 visits   OT Duration 8 weeks   OT Treatment/Interventions Self-care/ADL training;Moist Heat;Fluidtherapy;DME and/or AE instruction;Splinting;Patient/family education;Contrast Bath;Compression bandaging;Therapeutic exercises;Therapeutic activities;Scar mobilization;Ultrasound;Cryotherapy;Passive range of motion;Parrafin;Electrical Stimulation;Manual Therapy   Plan Reinforce A/ROM, and P/ROM exercises, begin gentle strengthening exercises at 8 weeks post op per protocol.       Patient will benefit from skilled therapeutic intervention in order to improve the following deficits and impairments:  Decreased coordination, Increased edema, Impaired sensation, Decreased knowledge of precautions, Decreased skin integrity, Impaired UE functional use, Pain, Decreased strength  Visit Diagnosis: Stiffness of right hand, not elsewhere classified  Muscle weakness (generalized)  Other lack of coordination  Pain in joint of right hand    Problem List Patient Active Problem List   Diagnosis Date Noted  . Postpartum care and examination 06/05/2013  . Anemia 06/05/2013  . Intrauterine contraceptive device 06/05/2013    RINE,KATHRYN 09/12/2016, 4:56 PM Theone Murdoch,  OTR/L [phone] Phone: [phone] 5:11 PM 09/12/16 [facility] [address] [place], [place], [address] Phone: [phone]   Fax:  [phone]  Name: Kinnley Paulson MRN: [id number] Date of Birth: [birth year]

## 2016-09-18 ENCOUNTER — Encounter: Payer: Medicaid Other | Admitting: *Deleted

## 2016-09-25 ENCOUNTER — Ambulatory Visit: Payer: Medicaid Other | Attending: Orthopedic Surgery | Admitting: Occupational Therapy

## 2019-05-03 ENCOUNTER — Ambulatory Visit: Payer: Self-pay

## 2019-05-03 NOTE — Telephone Encounter (Signed)
Called pt and did not leave message. Will close out triage note.

## 2020-05-27 ENCOUNTER — Ambulatory Visit
Admission: EM | Admit: 2020-05-27 | Discharge: 2020-05-27 | Disposition: A | Discharge status: Home / Self Care | Payer: Medicaid Other | Attending: Emergency Medicine | Admitting: Emergency Medicine

## 2020-05-27 ENCOUNTER — Other Ambulatory Visit: Payer: Self-pay

## 2020-05-27 DIAGNOSIS — J069 Acute upper respiratory infection, unspecified: Secondary | ICD-10-CM

## 2020-05-27 MED ORDER — FLUTICASONE PROPIONATE 50 MCG/ACT NA SUSP: 1.0000 | Freq: Every day | NASAL | 0 refills | Status: AC

## 2020-05-27 NOTE — ED Provider Notes (Signed)
[facility]    CSN: [id number] Arrival date & time: 05/27/20  1903      History   Chief Complaint Chief Complaint  Patient presents with  . Nasal Congestion    Since this am    HPI Kim Kirby is a 30 y.o. female presenting today for evaluation of nasal congestion.  Nasal congestion began today.  Denies other URI symptoms.  Daughter here with URI symptoms and cough.  Denies any fevers.  Denies any known Covid exposures.  HPI  Past Medical History:  Diagnosis Date  . Asthma    as a child  . Seasonal allergic rhinitis    spring  . Wears glasses     Patient Active Problem List   Diagnosis Date Noted  . Postpartum care and examination 06/05/2013  . Anemia 06/05/2013  . Intrauterine contraceptive device 06/05/2013    Past Surgical History:  Procedure Laterality Date  . NO PAST SURGERIES    . WOUND EXPLORATION Right 07/24/2016   Procedure: RIGHT RING AND SMALL FINGER WOUND EXPLORATIONS AND TENDON AND NERVE REPAIR AS INDICATED;  Surgeon: Bradly Bienenstock, MD;  Location: [facility] OR;  Service: Orthopedics;  Laterality: Right;    OB History    Gravida  1   Para  1   Term  1   Preterm      AB      Living  1     SAB      IAB      Ectopic      Multiple      Live Births  1            Home Medications    Prior to Admission medications   Medication Sig Start Date End Date Taking? Authorizing Provider  acetaminophen (TYLENOL) 325 MG tablet Take 325 mg by mouth every 6 (six) hours as needed for moderate pain or headache.    Yes [provider]  fluticasone (FLONASE) 50 MCG/ACT nasal spray Place 1-2 sprays into both nostrils daily. 05/27/20  Yes Topaz Raglin C, PA-C  ibuprofen (ADVIL,MOTRIN) 600 MG tablet Take 1 tablet (600 mg total) by mouth every 6 (six) hours. 04/25/13  Yes Montez Morita, CNM    Family History Family History  Problem Relation Age of Onset  . Hypertension Mother   . Other Father        unknown  . Cancer Maternal  Aunt        lung  . Heart disease Neg Hx   . Diabetes Neg Hx   . Stroke Neg Hx     Social History Social History   Tobacco Use  . Smoking status: Current Some Day Smoker  . Smokeless tobacco: Never Used  . Tobacco comment: denies  Vaping Use  . Vaping Use: Never used  Substance Use Topics  . Alcohol use: No    Comment: sometimes  . Drug use: No     Allergies   Patient has no known allergies.   Review of Systems Review of Systems  Constitutional: Negative for activity change, appetite change, chills, fatigue and fever.  HENT: Positive for congestion and rhinorrhea. Negative for ear pain, sinus pressure, sore throat and trouble swallowing.   Eyes: Negative for discharge and redness.  Respiratory: Negative for cough, chest tightness and shortness of breath.   Cardiovascular: Negative for chest pain.  Gastrointestinal: Negative for abdominal pain, diarrhea, nausea and vomiting.  Musculoskeletal: Negative for myalgias.  Skin: Negative for rash.  Neurological: Negative for  dizziness, light-headedness and headaches.     Physical Exam Triage Vital Signs ED Triage Vitals  Enc Vitals Group     BP      Pulse      Resp      Temp      Temp src      SpO2      Weight      Height      Head Circumference      Peak Flow      Pain Score      Pain Loc      Pain Edu?      Excl. in GC?    No data found.  Updated Vital Signs BP 109/84 (BP Location: Left Arm)   Pulse 95   Temp 98.1 F (36.7 C) (Oral)   Resp 18   SpO2 98%   Visual Acuity Right Eye Distance:   Left Eye Distance:   Bilateral Distance:    Right Eye Near:   Left Eye Near:    Bilateral Near:     Physical Exam Vitals and nursing note reviewed.  Constitutional:      Appearance: She is well-developed and well-nourished.     Comments: No acute distress  HENT:     Head: Normocephalic and atraumatic.     Ears:     Comments: Bilateral ears without tenderness to palpation of external auricle, tragus  and mastoid, EAC's without erythema or swelling, TM's with good bony landmarks and cone of light. Non erythematous.     Nose: Nose normal.     Mouth/Throat:     Comments: Oral mucosa pink and moist, no tonsillar enlargement or exudate. Posterior pharynx patent and nonerythematous, no uvula deviation or swelling. Normal phonation. Eyes:     Conjunctiva/sclera: Conjunctivae normal.  Cardiovascular:     Rate and Rhythm: Normal rate and regular rhythm.  Pulmonary:     Effort: Pulmonary effort is normal. No respiratory distress.     Comments: Breathing comfortably at rest, CTABL, no wheezing, rales or other adventitious sounds auscultated Abdominal:     General: There is no distension.  Musculoskeletal:        General: Normal range of motion.     Cervical back: Neck supple.  Skin:    General: Skin is warm and dry.  Neurological:     Mental Status: She is alert and oriented to person, place, and time.  Psychiatric:        Mood and Affect: Mood and affect normal.      UC Treatments / Results  Labs (all labs ordered are listed, but only abnormal results are displayed) Labs Reviewed  NOVEL CORONAVIRUS, NAA    EKG   Radiology No results found.  Procedures Procedures (including critical care time)  Medications Ordered in UC Medications - No data to display  Initial Impression / Assessment and Plan / UC Course  I have reviewed the triage vital signs and the nursing notes.  Pertinent labs & imaging results that were available during my care of the patient were reviewed by me and considered in my medical decision making (see chart for details).     Viral URI-Covid test pending, exam reassuring, recommending symptomatic and supportive care rest and fluids.  Discussed strict return precautions. Patient verbalized understanding and is agreeable with plan.  Final Clinical Impressions(s) / UC Diagnoses   Final diagnoses:  Viral URI     Discharge Instructions     Covid  test pending, monitor MyChart for  results Tylenol and ibuprofen as needed for any headaches body aches fevers Rest and fluids Flonase nasal spray 1 to 2 spray in each nostril daily for congestion, may supplement with over-the-counter Zyrtec, Claritin, Mucinex or Sudafed for further relief of congestion Follow-up if any symptoms not improving or worsening    ED Prescriptions    Medication Sig Dispense Auth. Provider   fluticasone (FLONASE) 50 MCG/ACT nasal spray Place 1-2 sprays into both nostrils daily. 16 g Janiel Derhammer, [place] C, PA-C     PDMP not reviewed this encounter.   Lew Dawes, [place] 05/27/20 1948

## 2020-05-27 NOTE — Discharge Instructions (Addendum)
Covid test pending, monitor MyChart for results Tylenol and ibuprofen as needed for any headaches body aches fevers Rest and fluids Flonase nasal spray 1 to 2 spray in each nostril daily for congestion, may supplement with over-the-counter Zyrtec, Claritin, Mucinex or Sudafed for further relief of congestion Follow-up if any symptoms not improving or worsening

## 2020-05-27 NOTE — ED Triage Notes (Signed)
Patient states she has had nasal congestion and drainage that began today. Pt is aox4 and ambulatory.

## 2020-05-29 LAB — NOVEL CORONAVIRUS, NAA: SARS-CoV-2, NAA: NOT DETECTED

## 2020-05-29 LAB — SARS-COV-2, NAA 2 DAY TAT

## 2021-08-08 ENCOUNTER — Encounter (HOSPITAL_COMMUNITY): Payer: Self-pay

## 2021-08-08 ENCOUNTER — Ambulatory Visit (INDEPENDENT_AMBULATORY_CARE_PROVIDER_SITE_OTHER): Payer: Medicaid Other

## 2021-08-08 ENCOUNTER — Ambulatory Visit (HOSPITAL_COMMUNITY)
Admission: EM | Admit: 2021-08-08 | Discharge: 2021-08-08 | Disposition: A | Discharge status: Home / Self Care | Payer: Medicaid Other | Attending: Physician Assistant | Admitting: Physician Assistant

## 2021-08-08 DIAGNOSIS — M5441 Lumbago with sciatica, right side: Secondary | ICD-10-CM | POA: Diagnosis not present

## 2021-08-08 DIAGNOSIS — M545 Low back pain, unspecified: Secondary | ICD-10-CM

## 2021-08-08 DIAGNOSIS — M5416 Radiculopathy, lumbar region: Secondary | ICD-10-CM

## 2021-08-08 MED ORDER — PREDNISONE 10 MG (21) PO TBPK: ORAL_TABLET | ORAL | 0 refills | Status: AC

## 2021-08-08 MED ORDER — TIZANIDINE HCL 4 MG PO TABS: 4.0000 mg | ORAL_TABLET | Freq: Three times a day (TID) | ORAL | 0 refills | Status: AC | PRN

## 2021-08-08 NOTE — ED Triage Notes (Signed)
Pt c/o rt lower back/hip pain radiating down rt leg with burning and numbness sensation since yesterday morning. Denies injury.  ?

## 2021-08-08 NOTE — Discharge Instructions (Signed)
Your x-ray shows some degeneration in the bottom of your back.  I believe this is pushing on a nerve causing the pain you are experiencing.  Please start prednisone taper as prescribed.  You should not take NSAIDs including aspirin, ibuprofen/Advil, naproxen/Aleve with this medication as it can cause stomach bleeding.  You can use Tylenol for breakthrough pain.  You can take tizanidine/Zanaflex up to 3 times a day.  This will make you sleepy so do not drive or drink alcohol while taking it.  It is okay to take it just at night if needed.  Use heat and gentle stretch for symptom relief.  If your symptoms or not improving quickly with medication please follow-up with orthopedics; call to schedule an appointment.  If at any point you have worsening symptoms including severe pain, difficulty walking, numbness on the inner part of your thigh, going to the bathroom on yourself without noticing you need to go to the hospital immediately as we discussed. ?

## 2021-08-08 NOTE — ED Provider Notes (Signed)
?[facility] ? ? ? ?CSN: [id number] ?Arrival date & time: 08/08/21  1151 ? ? ?  ? ?History   ?Chief Complaint ?Chief Complaint  ?Patient presents with  ? Back Pain  ? ? ?HPI ?Kim Kirby is a 31 y.o. female.  ? ?Patient presents today with a 2-day history of right lumbar back pain with radiation throughout lower leg.  She denies any known injury or increase in activity prior to symptom onset but reports that she had been up dancing the night before and then woke up with the pain the following day.  Pain has been persistent and stable since onset.  It is currently rated 10 on a 0-10 pain scale, described as spasming/shooting, worse with certain movements, no alleviating factors identified.  She denies previous injury or surgery involving her back.  She denies any bowel/bladder incontinence, lower extremity weakness, saddle anesthesia, difficulty ambulating.  She has tried ibuprofen without improvement of symptoms.  She denies history of malignancy.  She is confident she is not pregnant. ? ? ?Past Medical History:  ?Diagnosis Date  ? Asthma   ? as a child  ? Seasonal allergic rhinitis   ? spring  ? Wears glasses   ? ? ?Patient Active Problem List  ? Diagnosis Date Noted  ? Postpartum care and examination 06/05/2013  ? Anemia 06/05/2013  ? Intrauterine contraceptive device 06/05/2013  ? ? ?Past Surgical History:  ?Procedure Laterality Date  ? NO PAST SURGERIES    ? WOUND EXPLORATION Right 07/24/2016  ? Procedure: RIGHT RING AND SMALL FINGER WOUND EXPLORATIONS AND TENDON AND NERVE REPAIR AS INDICATED;  Surgeon: Bradly Bienenstock, MD;  Location: [facility] OR;  Service: Orthopedics;  Laterality: Right;  ? ? ?OB History   ? ? Gravida  ?1  ? Para  ?1  ? Term  ?1  ? Preterm  ?   ? AB  ?   ? Living  ?1  ?  ? ? SAB  ?   ? IAB  ?   ? Ectopic  ?   ? Multiple  ?   ? Live Births  ?1  ?   ?  ?  ? ? ? ?Home Medications   ? ?Prior to Admission medications   ?Medication Sig Start Date End Date Taking? Authorizing Provider  ?predniSONE  (STERAPRED UNI-PAK 21 TAB) 10 MG (21) TBPK tablet As directed 08/08/21  Yes Mialee Weyman K, PA-C  ?tiZANidine (ZANAFLEX) 4 MG tablet Take 1 tablet (4 mg total) by mouth every 8 (eight) hours as needed for muscle spasms. 08/08/21  Yes Jazzmyn Filion, Noberto Retort, PA-C  ?acetaminophen (TYLENOL) 325 MG tablet Take 325 mg by mouth every 6 (six) hours as needed for moderate pain or headache.     [provider]  ?fluticasone (FLONASE) 50 MCG/ACT nasal spray Place 1-2 sprays into both nostrils daily. 05/27/20   Wieters, Hallie C, PA-C  ? ? ?Family History ?Family History  ?Problem Relation Age of Onset  ? Hypertension Mother   ? Other Father   ?     unknown  ? Cancer Maternal Aunt   ?     lung  ? Heart disease Neg Hx   ? Diabetes Neg Hx   ? Stroke Neg Hx   ? ? ?Social History ?Social History  ? ?Tobacco Use  ? Smoking status: Some Days  ? Smokeless tobacco: Never  ? Tobacco comments:  ?  denies  ?Vaping Use  ? Vaping Use: Never used  ?Substance Use  Topics  ? Alcohol use: No  ?  Comment: sometimes  ? Drug use: No  ? ? ? ?Allergies   ?Patient has no known allergies. ? ? ?Review of Systems ?Review of Systems  ?Constitutional:  Positive for activity change. Negative for appetite change, fatigue and fever.  ?Respiratory:  Negative for cough and shortness of breath.   ?Cardiovascular:  Negative for chest pain.  ?Gastrointestinal:  Negative for abdominal pain, diarrhea, nausea and vomiting.  ?Musculoskeletal:  Positive for back pain. Negative for arthralgias and myalgias.  ?Neurological:  Negative for dizziness, weakness, light-headedness, numbness and headaches.  ? ? ?Physical Exam ?Triage Vital Signs ?ED Triage Vitals [08/08/21 1255]  ?Enc Vitals Group  ?   BP 128/79  ?   Pulse Rate 81  ?   Resp 18  ?   Temp 98.4 ?F (36.9 ?C)  ?   Temp Source Oral  ?   SpO2 100 %  ?   Weight   ?   Height   ?   Head Circumference   ?   Peak Flow   ?   Pain Score 10  ?   Pain Loc   ?   Pain Edu?   ?   Excl. in GC?   ? ?No data found. ? ?Updated Vital  Signs ?BP 128/79 (BP Location: Left Arm)   Pulse 81   Temp 98.4 ?F (36.9 ?C) (Oral)   Resp 18   SpO2 100%  ? ?Visual Acuity ?Right Eye Distance:   ?Left Eye Distance:   ?Bilateral Distance:   ? ?Right Eye Near:   ?Left Eye Near:    ?Bilateral Near:    ? ?Physical Exam ?Vitals reviewed.  ?Constitutional:   ?   General: She is awake. She is not in acute distress. ?   Appearance: Normal appearance. She is well-developed. She is not ill-appearing.  ?   Comments: Very pleasant female appears stated age in no acute distress sitting comfortably on exam room table  ?HENT:  ?   Head: Normocephalic and atraumatic.  ?Cardiovascular:  ?   Rate and Rhythm: Normal rate and regular rhythm.  ?   Heart sounds: Normal heart sounds, S1 normal and S2 normal. No murmur heard. ?Pulmonary:  ?   Effort: Pulmonary effort is normal.  ?   Breath sounds: Normal breath sounds. No wheezing, rhonchi or rales.  ?   Comments: Clear to auscultation bilaterally ?Abdominal:  ?   Palpations: Abdomen is soft.  ?   Tenderness: There is no abdominal tenderness.  ?Musculoskeletal:  ?   Cervical back: No tenderness or bony tenderness.  ?   Thoracic back: No tenderness or bony tenderness.  ?   Lumbar back: Tenderness and bony tenderness present. Negative right straight leg raise test and negative left straight leg raise test.  ?   Comments: Lumbar back: Pain percussion of lumbar vertebrae.  Tenderness palpation of the lumbar paraspinal muscles.  Strength 5/5 bilateral lower extremities.  ?Psychiatric:     ?   Behavior: Behavior is cooperative.  ? ? ? ?UC Treatments / Results  ?Labs ?(all labs ordered are listed, but only abnormal results are displayed) ?Labs Reviewed - No data to display ? ?EKG ? ? ?Radiology ?DG Lumbar Spine Complete ? ?Result Date: 08/08/2021 ?CLINICAL DATA:  Radiculopathy. Right lower back and hip pain radiating down the right leg with burning and numbness since yesterday. EXAM: LUMBAR SPINE - COMPLETE 4+ VIEW COMPARISON:  None.  FINDINGS: There are 5  non rib-bearing lumbar type vertebrae. There is trace retrolisthesis of L5 on S1. No fracture is identified. There is moderate to severe disc space narrowing at L5-S1 with mild narrowing at L4-5. An IUD projects over the central aspect of the pelvis. IMPRESSION: 1. No acute osseous abnormality. 2. 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 Electronically Signed   By: Sebastian AcheAllen  Grady M.D.   On: 08/08/2021 13:50   ? ?Procedures ?Procedures (including critical care time) ? ?Medications Ordered in UC ?Medications - No data to display ? ?Initial Impression / Assessment and Plan / UC Course  ?I have reviewed the triage vital signs and the nursing notes. ? ?Pertinent labs & imaging results that were available during my care of the patient were reviewed by me and considered in my medical decision making (see chart for details). ? ?  ? ?X-ray obtained given bony tenderness and radiculopathy showed degenerative changes at L5/S1 but no acute osseous abnormality.  Discussed that symptoms are likely related to sciatica.  Patient has failed NSAIDs so we will use steroid taper to manage symptoms.  Discussed that she should not take NSAIDs with this medication due to risk of GI bleeding but can use Tylenol for breakthrough pain.  She was prescribed Zanaflex to be used up to 3 times a day with instruction not to drive or drink alcohol with taking this medication.  She is to use heat, rest, stretch for additional symptom relief.  Discussed that if symptoms or not improving she should follow-up with orthopedics to consider advanced imaging and/or additional interventions such as physical therapy.  She was given contact information for local provider who is on-call with instruction to call to schedule an appointment if symptoms or not proving quickly.  Discussed alarm symptoms that warrant emergent evaluation.  Strict return precautions given to which patient expressed understanding.  Work excuse note  provided. ? ?Final Clinical Impressions(s) / UC Diagnoses  ? ?Final diagnoses:  ?Acute right-sided low back pain with right-sided sciatica  ? ? ? ?Discharge Instructions   ? ?  ?Your x-ray shows some degeneration in the bottom

## 2021-08-17 ENCOUNTER — Other Ambulatory Visit: Payer: Self-pay

## 2021-08-17 ENCOUNTER — Emergency Department (HOSPITAL_COMMUNITY)
Admission: EM | Admit: 2021-08-17 | Discharge: 2021-08-17 | Disposition: A | Discharge status: Home / Self Care | Payer: Medicaid Other | Attending: Emergency Medicine | Admitting: Emergency Medicine

## 2021-08-17 ENCOUNTER — Encounter (HOSPITAL_COMMUNITY): Payer: Self-pay | Admitting: Emergency Medicine

## 2021-08-17 DIAGNOSIS — M79604 Pain in right leg: Secondary | ICD-10-CM | POA: Insufficient documentation

## 2021-08-17 DIAGNOSIS — M5431 Sciatica, right side: Secondary | ICD-10-CM

## 2021-08-17 DIAGNOSIS — M5441 Lumbago with sciatica, right side: Secondary | ICD-10-CM | POA: Diagnosis not present

## 2021-08-17 MED ORDER — METHOCARBAMOL 500 MG PO TABS: 500.0000 mg | ORAL_TABLET | Freq: Two times a day (BID) | ORAL | 0 refills | Status: AC

## 2021-08-17 NOTE — ED Notes (Signed)
I provided reinforced discharge education based off of discharge instructions. Pt acknowledged and understood my education. Pt had no further questions/concerns for provider/myself.  °

## 2021-08-17 NOTE — ED Triage Notes (Signed)
Pt reports right sided leg pain that radiates from her back and down into her leg w/ burning sensation.  ?

## 2021-08-17 NOTE — Discharge Instructions (Addendum)
It was a pleasure taking care of you today!  ? ?You are prescribed Robaxin, take as prescribed.  Do not drive or operate heavy machinery while taking this medication as it can make you drowsy.  You may take over-the-counter 600 mg ibuprofen every 6 hours or 1000 mg Tylenol every 6 hours as needed for pain.  It is important that you maintain follow-up with the orthopedist. You may apply heat to the affected area for up to 15 minutes at a time.  Ensure to place a barrier between your skin and the heat. Return to the Emergency Department if you are experiencing loss of bowel or bladder, increasing/worsening symptoms, fever, inability to walk. ?

## 2021-08-17 NOTE — ED Provider Notes (Signed)
?[facility]-EMERGENCY DEPT ?Provider Note ? ? ?CSN: [id number] ?Arrival date & time: 08/17/21  1053 ? ?  ? ?History ? ?Chief Complaint  ?Patient presents with  ? Leg Pain  ? ? ?Kim Kirby is a 31 y.o. female who presents to the ED complaining of right leg pain onset 08/06/2021. She notes that she was evaluated at Urgent Care on 08/08/21 for back pain. She was prescribed prednisone and tizanidine. Also given information for orthopedist and informed to follow up. Pt notes a burning sensation noted down her posterior right leg to her foot. Her back pain is worse with bending. She notes that since she was evaluated by urgent care, she has been lounging around more. Also notes spasm sensation to her leg. No recent new injury, trauma, or fall. Has taken the prescription medication and tylenol with relief of her symptoms. Denies fever, bowel/bladder incontinence, numbness, tingling, weakness, or gait problem. Pt had a follow up appointment with her orthopedist this morning, however, arrived late to her appointment and wasn't able to be seen. She set up a follow-up appointment with orthopedist on 08/24/2021. Takes OCP, denies DVT history, recent immobilization, or anticoagulant use.  ? ?  ? 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 Pt was sent a prescription for prednisone and tizanidine.  ? ?The history is provided by the patient. No language interpreter was used.  ? ?  ? ?Home Medications ?Prior to Admission medications   ?Medication Sig Start Date End Date Taking? Authorizing Provider  ?methocarbamol (ROBAXIN) 500 MG tablet Take 1 tablet (500 mg total) by mouth 2 (two) times daily. 08/17/21  Yes Longino Trefz A, PA-C  ?acetaminophen (TYLENOL) 325 MG tablet Take 325 mg by mouth every 6 (six) hours as needed for moderate pain or headache.     [provider]  ?fluticasone (FLONASE) 50  MCG/ACT nasal spray Place 1-2 sprays into both nostrils daily. 05/27/20   Wieters, Hallie C, PA-C  ?predniSONE (STERAPRED UNI-PAK 21 TAB) 10 MG (21) TBPK tablet As directed 08/08/21   Raspet, Erin K, PA-C  ?tiZANidine (ZANAFLEX) 4 MG tablet Take 1 tablet (4 mg total) by mouth every 8 (eight) hours as needed for muscle spasms. 08/08/21   Raspet, Noberto Retort, PA-C  ?   ? ?Allergies    ?Patient has no known allergies.   ? ?Review of Systems   ?Review of Systems  ?Constitutional:  Negative for fever.  ?Gastrointestinal:   ?     -Bowel incontinence  ?Genitourinary:   ?     -Bladder incontinence  ?Musculoskeletal:  Positive for arthralgias and back pain. Negative for gait problem.  ?Skin:  Negative for color change and wound.  ?Neurological:  Negative for weakness and numbness.  ?     -Tingling  ?All other systems reviewed and are negative. ? ?Physical Exam ?Updated Vital Signs ?BP (!) 133/93 (BP Location: Left Arm)   Pulse (!) 102   Temp 98.4 ?F (36.9 ?C) (Oral)   Resp 18   SpO2 97%  ?Physical Exam ?Vitals and nursing note reviewed.  ?Constitutional:   ?   General: She is not in acute distress. ?   Appearance: She is not diaphoretic.  ?HENT:  ?   Head: Normocephalic and atraumatic.  ?   Mouth/Throat:  ?   Pharynx: No oropharyngeal exudate.  ?Eyes:  ?   General: No scleral icterus. ?  Conjunctiva/sclera: Conjunctivae normal.  ?Cardiovascular:  ?   Rate and Rhythm: Normal rate and regular rhythm.  ?   Pulses: Normal pulses.  ?   Heart sounds: Normal heart sounds.  ?Pulmonary:  ?   Effort: Pulmonary effort is normal. No respiratory distress.  ?   Breath sounds: Normal breath sounds. No wheezing.  ?Abdominal:  ?   General: Bowel sounds are normal.  ?   Palpations: Abdomen is soft. There is no mass.  ?   Tenderness: There is no abdominal tenderness. There is no guarding or rebound.  ?Musculoskeletal:     ?   General: Normal range of motion.  ?   Cervical back: Normal range of motion and neck supple.  ?   Comments: No spinal  tenderness to palpation.  No tenderness to palpation noted to paraspinal region.  Positive right straight leg raise.  Negative left straight leg raise.  Able to ambulate without assistance or difficulty.  Tenderness to palpation noted to right ankle.  Pedal pulses intact.  Sensation intact.  ?Skin: ?   General: Skin is warm and dry.  ?Neurological:  ?   Mental Status: She is alert.  ?Psychiatric:     ?   Behavior: Behavior normal.  ? ? ?ED Results / Procedures / Treatments   ?Labs ?(all labs ordered are listed, but only abnormal results are displayed) ?Labs Reviewed - No data to display ? ?EKG ?None ? ?Radiology ?No results found. ? ?Procedures ?Procedures  ? ? ?Medications Ordered in ED ?Medications - No data to display ? ?ED Course/ Medical Decision Making/ A&P ?Clinical Course as of 08/18/21 0631  ?Thu Aug 17, 2021  ?1146 Discussed discharge treatment plan with patient at bedside.  Patient appears safe for discharge at this time. [SB]  ?  ?Clinical Course User Index ?[SB] Melody Savidge A, PA-C  ? ?                        ?Medical Decision Making ?Risk ?Prescription drug management. ? ? ?Patient with right lower back pain with radiation down the posterior right leg.  Patient was recently evaluated in urgent care on 08/08/2021 and diagnosed with sciatica.  Imaging at that time was negative.  No loss of bowel or bladder control.  No numbness, tingling, weakness. Vital signs stable, patient afebrile.  On exam patient with, strength and sensation intact to bilateral lower extremities.  Pedal pulses intact bilaterally.  No spinal tenderness to palpation.  Minimal tenderness to palpation noted to right lumbar musculature.  Positive right straight leg raise.  No acute cardiovascular or respiratory exam findings.  Patient is ambulatory without assistance or difficulty. No concerning findings on exams. No imaging indicated at this time due to absence of red flag symptoms or any recent new trauma or injury to the back.   Differential diagnosis includes muscle strain, fracture, herniation, sciatica, cauda equina. ? 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 Pt was sent a prescription for prednisone and tizanidine.  ? ? ?Disposition: ?Presentation suspicious for sciatica of right side. Doubt acute fracture or herniation at this time. After consideration of the diagnostic results and the patients response to treatment, I feel that the patient would benefit from Discharge home.  Stressed importance to patient's regarding maintaining follow-up appointment with her orthopedist on 08/24/21.  Patient given prescription for Robaxin in the ED.  Discussed with patient importance of not driving or operating heavy machinery while taking this medication.  Also discussed with patient utilization of lidocaine patches over-the-counter.  Supportive care measures and strict return precautions discussed with patient at bedside. Pt acknowledges and verbalizes understanding. Pt appears safe for discharge. Follow up as indicated in discharge paperwork.  ? ? ?This chart was dictated using voice recognition software, Dragon. Despite the best efforts of this provider to proofread and correct errors, errors may still occur which can change documentation meaning. ? ? ?Final Clinical Impression(s) / ED Diagnoses ?Final diagnoses:  ?Sciatica of right side  ? ? ?Rx / DC Orders ?ED Discharge Orders   ? ?      Ordered  ?  methocarbamol (ROBAXIN) 500 MG tablet  2 times daily       ? 08/17/21 1213  ? ?  ?  ? ?  ? ? ?  ?Kazzandra Desaulniers A, PA-C ?08/18/21 0631 ? ?  ?Virgina Norfolk, DO ?08/18/21 [id number] ? ?

## 2021-08-17 NOTE — ED Notes (Signed)
Pt ambulatory without assistance.
